# Patient Record
Sex: Male | Born: 1960 | Race: White | Hispanic: No | Marital: Married | Smoking: Never smoker
Health system: Southern US, Community
[De-identification: ages and names within clinical notes are randomized; demographics above are authoritative.]

## PROBLEM LIST (undated history)

## (undated) DIAGNOSIS — E039 Hypothyroidism, unspecified: Secondary | ICD-10-CM

## (undated) DIAGNOSIS — M1990 Unspecified osteoarthritis, unspecified site: Secondary | ICD-10-CM

## (undated) HISTORY — PX: ROTATOR CUFF REPAIR: SHX139

## (undated) HISTORY — PX: ANKLE ARTHROSCOPY WITH REPAIR SUBLUXING TENDON: SHX5584

## (undated) HISTORY — PX: TENDON REPAIR: SHX5111

## (undated) HISTORY — PX: TOTAL KNEE ARTHROPLASTY: SHX125

## (undated) HISTORY — PX: AMPUTATION FINGER: SHX6594

## (undated) HISTORY — PX: JOINT REPLACEMENT: SHX530

---

## 2016-07-22 ENCOUNTER — Encounter
Admission: RE | Admit: 2016-07-22 | Discharge: 2016-07-22 | Disposition: A | Discharge status: Home / Self Care | Payer: BLUE CROSS/BLUE SHIELD | Source: Ambulatory Visit | Attending: Orthopedic Surgery | Admitting: Orthopedic Surgery

## 2016-07-22 DIAGNOSIS — Z01810 Encounter for preprocedural cardiovascular examination: Secondary | ICD-10-CM | POA: Insufficient documentation

## 2016-07-22 DIAGNOSIS — I10 Essential (primary) hypertension: Secondary | ICD-10-CM | POA: Insufficient documentation

## 2016-07-22 DIAGNOSIS — Z01812 Encounter for preprocedural laboratory examination: Secondary | ICD-10-CM | POA: Insufficient documentation

## 2016-07-22 HISTORY — DX: Unspecified osteoarthritis, unspecified site: M19.90

## 2016-07-22 LAB — CBC
HCT: 43.1 % (ref 40.0–52.0)
HEMOGLOBIN: 14.1 g/dL (ref 13.0–18.0)
MCH: 26.6 pg (ref 26.0–34.0)
MCHC: 32.7 g/dL (ref 32.0–36.0)
MCV: 81.3 fL (ref 80.0–100.0)
Platelets: 248 10*3/uL (ref 150–440)
RBC: 5.3 MIL/uL (ref 4.40–5.90)
RDW: 16.7 % — AB (ref 11.5–14.5)
WBC: 5.5 10*3/uL (ref 3.8–10.6)

## 2016-07-22 LAB — PROTIME-INR
INR: 1
PROTHROMBIN TIME: 13.2 s (ref 11.4–15.2)

## 2016-07-22 LAB — BASIC METABOLIC PANEL
Anion gap: 8 (ref 5–15)
BUN: 17 mg/dL (ref 6–20)
CO2: 30 mmol/L (ref 22–32)
Calcium: 9.5 mg/dL (ref 8.9–10.3)
Chloride: 102 mmol/L (ref 101–111)
Creatinine, Ser: 0.84 mg/dL (ref 0.61–1.24)
GFR calc Af Amer: >60 mL/min (ref >60)
Glucose, Bld: 128 mg/dL — ABNORMAL HIGH (ref 65–99)
POTASSIUM: 4.4 mmol/L (ref 3.5–5.1)
SODIUM: 140 mmol/L (ref 135–145)

## 2016-07-22 LAB — URINALYSIS, COMPLETE (UACMP) WITH MICROSCOPIC
BACTERIA UA: NONE SEEN
Bilirubin Urine: NEGATIVE
Glucose, UA: NEGATIVE mg/dL
Hgb urine dipstick: NEGATIVE
Ketones, ur: NEGATIVE mg/dL
Leukocytes, UA: NEGATIVE
Nitrite: NEGATIVE
PROTEIN: NEGATIVE mg/dL
RBC / HPF: NONE SEEN RBC/hpf (ref 0–5)
Specific Gravity, Urine: 1.019 (ref 1.005–1.030)
WBC UA: NONE SEEN WBC/hpf (ref 0–5)
pH: 5 (ref 5.0–8.0)

## 2016-07-22 LAB — SURGICAL PCR SCREEN
MRSA, PCR: NEGATIVE
Staphylococcus aureus: NEGATIVE

## 2016-07-22 LAB — SEDIMENTATION RATE: SED RATE: 1 mm/h (ref 0–20)

## 2016-07-22 LAB — TYPE AND SCREEN
ABO/RH(D): A POS
ANTIBODY SCREEN: NEGATIVE

## 2016-07-22 LAB — APTT: aPTT: 26 seconds (ref 24–36)

## 2016-07-22 NOTE — Pre-Procedure Instructions (Signed)
Rt upper thigh red and blotchy. Saw his PA yesterday.  Placed on antibiotics.  ? Cellulitis.

## 2016-07-22 NOTE — Patient Instructions (Signed)
Your procedure is scheduled on: 08/04/16 Tues Report to Same Day Surgery 2nd floor medical mall [facility](Medical Mall Entrance-take elevator on left to 2nd floor.  Check in with surgery information desk.) To find out your arrival time please call [phone] between 1PM - 3PM on 08/03/16 Mon  Remember: Instructions that are not followed completely may result in serious medical risk, up to and including death, or upon the discretion of your surgeon and anesthesiologist your surgery may need to be rescheduled.    _x___ 1. Do not eat food or drink liquids after midnight. No gum chewing or                              hard candies.     __x__ 2. No Alcohol for 24 hours before or after surgery.   __x__3. No Smoking for 24 prior to surgery.   ____  4. Bring all medications with you on the day of surgery if instructed.    __x__ 5. Notify your doctor if there is any change in your medical condition     (cold, fever, infections).     Do not wear jewelry, make-up, hairpins, clips or nail polish.  Do not wear lotions, powders, or perfumes. You may wear deodorant.  Do not shave 48 hours prior to surgery. Men may shave face and neck.  Do not bring valuables to the hospital.    [facility] is not responsible for any belongings or valuables.               Contacts, dentures or bridgework may not be worn into surgery.  Leave your suitcase in the car. After surgery it may be brought to your room.  For patients admitted to the hospital, discharge time is determined by your                       treatment team.   Patients discharged the day of surgery will not be allowed to drive home.  You will need someone to drive you home and stay with you the night of your procedure.    Please read over the following fact sheets that you were given:   [facility] Preparing for Surgery and or MRSA Information   _x___ Take anti-hypertensive (unless it includes a diuretic), cardiac, seizure, asthma,     anti-reflux and  psychiatric medicines. These include:  1. levothyroxine (SYNTHROID   2.traMADol (ULTRAM  3.  4.  5.  6.  ____Fleets enema or Magnesium Citrate as directed.   _x___ Use CHG Soap or sage wipes as directed on instruction sheet   ____ Use inhalers on the day of surgery and bring to hospital day of surgery  ____ Stop Metformin and Janumet 2 days prior to surgery.    ____ Take 1/2 of usual insulin dose the night before surgery and none on the morning     surgery.   _x___ Follow recommendations from Cardiologist, Pulmonologist or PCP regarding          stopping Aspirin, Coumadin, Pllavix ,Eliquis, Effient, or Pradaxa, and Pletal.  X____Stop Anti-inflammatories such as Advil, Aleve, Ibuprofen, Motrin, Naproxen, Naprosyn, Goodies powders or aspirin products.  Stop Meloxicam 1 week before surgery.  OK to take Tylenol and                          Celebrex.   _x___ Stop supplements  until after surgery.  But may continue Vitamin D, Vitamin B,       and multivitamin.   ____ Bring C-Pap to the hospital.

## 2016-07-23 LAB — URINE CULTURE: Culture: NO GROWTH

## 2016-08-03 MED ORDER — CEFAZOLIN SODIUM-DEXTROSE 2-4 GM/100ML-% IV SOLN: 2.0000 g | Freq: Once | INTRAVENOUS | Status: DC

## 2016-08-03 MED ORDER — TRANEXAMIC ACID 1000 MG/10ML IV SOLN
1000.0000 mg | INTRAVENOUS | Status: AC
  Administered 2016-08-04: 1000 mg via INTRAVENOUS
  Filled 2016-08-03: qty 10

## 2016-08-04 ENCOUNTER — Inpatient Hospital Stay: Payer: BLUE CROSS/BLUE SHIELD

## 2016-08-04 ENCOUNTER — Inpatient Hospital Stay: Payer: BLUE CROSS/BLUE SHIELD | Admitting: Anesthesiology

## 2016-08-04 ENCOUNTER — Encounter
Admission: RE | Disposition: A | Discharge status: Home / Self Care | Payer: Self-pay | Source: Ambulatory Visit | Attending: Orthopedic Surgery

## 2016-08-04 ENCOUNTER — Inpatient Hospital Stay
Admission: RE | Admit: 2016-08-04 | Discharge: 2016-08-06 | DRG: 470 | Disposition: A | Discharge status: Home / Self Care | Payer: BLUE CROSS/BLUE SHIELD | Source: Ambulatory Visit | Attending: Orthopedic Surgery | Admitting: Orthopedic Surgery

## 2016-08-04 ENCOUNTER — Encounter: Payer: Self-pay | Admitting: *Deleted

## 2016-08-04 DIAGNOSIS — E876 Hypokalemia: Secondary | ICD-10-CM | POA: Diagnosis not present

## 2016-08-04 DIAGNOSIS — G8918 Other acute postprocedural pain: Secondary | ICD-10-CM

## 2016-08-04 DIAGNOSIS — Z79899 Other long term (current) drug therapy: Secondary | ICD-10-CM

## 2016-08-04 DIAGNOSIS — E039 Hypothyroidism, unspecified: Secondary | ICD-10-CM | POA: Diagnosis present

## 2016-08-04 DIAGNOSIS — M1611 Unilateral primary osteoarthritis, right hip: Secondary | ICD-10-CM | POA: Diagnosis present

## 2016-08-04 DIAGNOSIS — Z419 Encounter for procedure for purposes other than remedying health state, unspecified: Secondary | ICD-10-CM

## 2016-08-04 HISTORY — PX: TOTAL HIP ARTHROPLASTY: SHX124

## 2016-08-04 HISTORY — DX: Hypothyroidism, unspecified: E03.9

## 2016-08-04 LAB — CBC
HCT: 35.8 % — ABNORMAL LOW (ref 40.0–52.0)
Hemoglobin: 11.9 g/dL — ABNORMAL LOW (ref 13.0–18.0)
MCH: 27 pg (ref 26.0–34.0)
MCHC: 33.1 g/dL (ref 32.0–36.0)
MCV: 81.6 fL (ref 80.0–100.0)
Platelets: 194 10*3/uL (ref 150–440)
RBC: 4.39 MIL/uL — ABNORMAL LOW (ref 4.40–5.90)
RDW: 16.1 % — AB (ref 11.5–14.5)
WBC: 6.5 10*3/uL (ref 3.8–10.6)

## 2016-08-04 LAB — CREATININE, SERUM
CREATININE: 0.72 mg/dL (ref 0.61–1.24)
GFR calc Af Amer: >60 mL/min (ref >60)

## 2016-08-04 LAB — ABO/RH: ABO/RH(D): A POS

## 2016-08-04 SURGERY — ARTHROPLASTY, HIP, TOTAL, ANTERIOR APPROACH
Anesthesia: Spinal | Laterality: Right | Wound class: Clean

## 2016-08-04 MED ORDER — METOCLOPRAMIDE HCL 10 MG PO TABS: 5.0000 mg | ORAL_TABLET | Freq: Three times a day (TID) | ORAL | Status: DC | PRN

## 2016-08-04 MED ORDER — BISACODYL 10 MG RE SUPP: 10.0000 mg | Freq: Every day | RECTAL | Status: DC | PRN

## 2016-08-04 MED ORDER — PROPOFOL 500 MG/50ML IV EMUL
INTRAVENOUS | Status: AC
  Filled 2016-08-04: qty 100

## 2016-08-04 MED ORDER — LEVOTHYROXINE SODIUM 150 MCG PO TABS
150.0000 ug | ORAL_TABLET | ORAL | Status: DC
  Administered 2016-08-05 – 2016-08-06 (×2): 150 ug via ORAL
  Filled 2016-08-04 (×2): qty 3

## 2016-08-04 MED ORDER — METHOCARBAMOL 1000 MG/10ML IJ SOLN
500.0000 mg | Freq: Four times a day (QID) | INTRAVENOUS | Status: DC | PRN
  Filled 2016-08-04: qty 5

## 2016-08-04 MED ORDER — ACETAMINOPHEN 325 MG PO TABS: 650.0000 mg | ORAL_TABLET | Freq: Four times a day (QID) | ORAL | Status: DC | PRN

## 2016-08-04 MED ORDER — CEFAZOLIN SODIUM-DEXTROSE 2-4 GM/100ML-% IV SOLN
INTRAVENOUS | Status: AC
  Filled 2016-08-04: qty 100

## 2016-08-04 MED ORDER — CEFAZOLIN SODIUM-DEXTROSE 2-4 GM/100ML-% IV SOLN
2.0000 g | Freq: Four times a day (QID) | INTRAVENOUS | Status: AC
  Administered 2016-08-04 (×3): 2 g via INTRAVENOUS
  Filled 2016-08-04 (×3): qty 100

## 2016-08-04 MED ORDER — HYDROMORPHONE HCL 1 MG/ML IJ SOLN
1.0000 mg | INTRAMUSCULAR | Status: DC | PRN
Start: 2016-08-04 — End: 2016-08-06
  Administered 2016-08-04: 1 mg via INTRAVENOUS
  Filled 2016-08-04: qty 1

## 2016-08-04 MED ORDER — CEFAZOLIN SODIUM-DEXTROSE 2-3 GM-% IV SOLR
INTRAVENOUS | Status: DC | PRN
  Administered 2016-08-04: 2 g via INTRAVENOUS

## 2016-08-04 MED ORDER — ACETAMINOPHEN 650 MG RE SUPP: 650.0000 mg | Freq: Four times a day (QID) | RECTAL | Status: DC | PRN

## 2016-08-04 MED ORDER — NEOMYCIN-POLYMYXIN B GU 40-200000 IR SOLN
Status: AC
  Filled 2016-08-04: qty 4

## 2016-08-04 MED ORDER — PROPOFOL 500 MG/50ML IV EMUL
INTRAVENOUS | Status: DC | PRN
  Administered 2016-08-04: 75 ug/kg/min via INTRAVENOUS

## 2016-08-04 MED ORDER — PHENYLEPHRINE HCL 10 MG/ML IJ SOLN
INTRAMUSCULAR | Status: DC | PRN
  Administered 2016-08-04: 30 ug/min via INTRAVENOUS

## 2016-08-04 MED ORDER — OXYCODONE HCL 5 MG PO TABS
5.0000 mg | ORAL_TABLET | ORAL | Status: DC | PRN
  Administered 2016-08-04 – 2016-08-06 (×10): 10 mg via ORAL
  Filled 2016-08-04 (×10): qty 2

## 2016-08-04 MED ORDER — PHENYLEPHRINE HCL 10 MG/ML IJ SOLN
INTRAMUSCULAR | Status: DC | PRN
  Administered 2016-08-04 (×2): 80 ug via INTRAVENOUS

## 2016-08-04 MED ORDER — MAGNESIUM CITRATE PO SOLN
1.0000 | Freq: Once | ORAL | Status: DC | PRN
  Filled 2016-08-04: qty 296

## 2016-08-04 MED ORDER — GLYCOPYRROLATE 0.2 MG/ML IJ SOLN
INTRAMUSCULAR | Status: AC
  Filled 2016-08-04: qty 1

## 2016-08-04 MED ORDER — FAMOTIDINE 20 MG PO TABS
20.0000 mg | ORAL_TABLET | Freq: Once | ORAL | Status: AC
  Administered 2016-08-04: 20 mg via ORAL

## 2016-08-04 MED ORDER — ONDANSETRON HCL 4 MG/2ML IJ SOLN: 4.0000 mg | Freq: Once | INTRAMUSCULAR | Status: DC | PRN

## 2016-08-04 MED ORDER — NEOMYCIN-POLYMYXIN B GU 40-200000 IR SOLN
Status: DC | PRN
Start: 2016-08-04 — End: 2016-08-04
  Administered 2016-08-04: 4 mL

## 2016-08-04 MED ORDER — BUPIVACAINE-EPINEPHRINE 0.25% -1:200000 IJ SOLN
INTRAMUSCULAR | Status: DC | PRN
Start: 2016-08-04 — End: 2016-08-04
  Administered 2016-08-04: 30 mL

## 2016-08-04 MED ORDER — MAGNESIUM HYDROXIDE 400 MG/5ML PO SUSP
30.0000 mL | Freq: Every day | ORAL | Status: DC | PRN
  Administered 2016-08-05: 30 mL via ORAL
  Filled 2016-08-04 (×2): qty 30

## 2016-08-04 MED ORDER — FENTANYL CITRATE (PF) 100 MCG/2ML IJ SOLN
INTRAMUSCULAR | Status: DC | PRN
  Administered 2016-08-04 (×2): 50 ug via INTRAVENOUS

## 2016-08-04 MED ORDER — MORPHINE SULFATE (PF) 2 MG/ML IV SOLN
2.0000 mg | INTRAVENOUS | Status: DC | PRN
  Administered 2016-08-04 – 2016-08-05 (×3): 2 mg via INTRAVENOUS
  Filled 2016-08-04 (×3): qty 1

## 2016-08-04 MED ORDER — MIDAZOLAM HCL 5 MG/5ML IJ SOLN
INTRAMUSCULAR | Status: DC | PRN
  Administered 2016-08-04 (×2): 1 mg via INTRAVENOUS

## 2016-08-04 MED ORDER — MENTHOL 3 MG MT LOZG
1.0000 | LOZENGE | OROMUCOSAL | Status: DC | PRN
  Filled 2016-08-04: qty 9

## 2016-08-04 MED ORDER — FAMOTIDINE 20 MG PO TABS
ORAL_TABLET | ORAL | Status: AC
  Administered 2016-08-04: 20 mg via ORAL
  Filled 2016-08-04: qty 1

## 2016-08-04 MED ORDER — FENTANYL CITRATE (PF) 100 MCG/2ML IJ SOLN
INTRAMUSCULAR | Status: AC
  Filled 2016-08-04: qty 2

## 2016-08-04 MED ORDER — ONDANSETRON HCL 4 MG PO TABS: 4.0000 mg | ORAL_TABLET | Freq: Four times a day (QID) | ORAL | Status: DC | PRN

## 2016-08-04 MED ORDER — GLYCOPYRROLATE 0.2 MG/ML IJ SOLN
INTRAMUSCULAR | Status: DC | PRN
  Administered 2016-08-04: 0.2 mg via INTRAVENOUS

## 2016-08-04 MED ORDER — DIPHENHYDRAMINE HCL 12.5 MG/5ML PO ELIX: 12.5000 mg | ORAL_SOLUTION | ORAL | Status: DC | PRN

## 2016-08-04 MED ORDER — ZOLPIDEM TARTRATE 5 MG PO TABS
5.0000 mg | ORAL_TABLET | Freq: Every evening | ORAL | Status: DC | PRN
  Administered 2016-08-05 (×2): 5 mg via ORAL
  Filled 2016-08-04 (×2): qty 1

## 2016-08-04 MED ORDER — ONDANSETRON HCL 4 MG/2ML IJ SOLN: 4.0000 mg | Freq: Four times a day (QID) | INTRAMUSCULAR | Status: DC | PRN

## 2016-08-04 MED ORDER — METOCLOPRAMIDE HCL 5 MG/ML IJ SOLN: 5.0000 mg | Freq: Three times a day (TID) | INTRAMUSCULAR | Status: DC | PRN

## 2016-08-04 MED ORDER — LACTATED RINGERS IV SOLN
INTRAVENOUS | Status: DC
  Administered 2016-08-04 (×2): via INTRAVENOUS

## 2016-08-04 MED ORDER — BUPIVACAINE-EPINEPHRINE (PF) 0.25% -1:200000 IJ SOLN
INTRAMUSCULAR | Status: AC
  Filled 2016-08-04: qty 30

## 2016-08-04 MED ORDER — MIDAZOLAM HCL 2 MG/2ML IJ SOLN
INTRAMUSCULAR | Status: AC
  Filled 2016-08-04: qty 2

## 2016-08-04 MED ORDER — PHENOL 1.4 % MT LIQD
1.0000 | OROMUCOSAL | Status: DC | PRN
  Filled 2016-08-04: qty 177

## 2016-08-04 MED ORDER — SODIUM CHLORIDE 0.9 % IV SOLN
INTRAVENOUS | Status: DC
  Administered 2016-08-04 (×2): via INTRAVENOUS

## 2016-08-04 MED ORDER — ENOXAPARIN SODIUM 40 MG/0.4ML ~~LOC~~ SOLN
40.0000 mg | SUBCUTANEOUS | Status: DC
  Administered 2016-08-05 – 2016-08-06 (×2): 40 mg via SUBCUTANEOUS
  Filled 2016-08-04 (×2): qty 0.4

## 2016-08-04 MED ORDER — HYDROMORPHONE HCL 1 MG/ML IJ SOLN
1.0000 mg | Freq: Once | INTRAMUSCULAR | Status: AC
  Administered 2016-08-04: 1 mg via INTRAVENOUS
  Filled 2016-08-04: qty 1

## 2016-08-04 MED ORDER — DOCUSATE SODIUM 100 MG PO CAPS
100.0000 mg | ORAL_CAPSULE | Freq: Two times a day (BID) | ORAL | Status: DC
  Administered 2016-08-04 – 2016-08-05 (×3): 100 mg via ORAL
  Filled 2016-08-04 (×5): qty 1

## 2016-08-04 MED ORDER — FENTANYL CITRATE (PF) 100 MCG/2ML IJ SOLN: 25.0000 ug | INTRAMUSCULAR | Status: DC | PRN

## 2016-08-04 MED ORDER — METHOCARBAMOL 500 MG PO TABS
500.0000 mg | ORAL_TABLET | Freq: Four times a day (QID) | ORAL | Status: DC | PRN
  Administered 2016-08-04 – 2016-08-05 (×4): 500 mg via ORAL
  Filled 2016-08-04 (×4): qty 1

## 2016-08-04 MED ORDER — LIDOCAINE HCL (PF) 2 % IJ SOLN
INTRAMUSCULAR | Status: AC
  Filled 2016-08-04: qty 2

## 2016-08-04 SURGICAL SUPPLY — 49 items
BLADE SAW SAG 18.5X105 (BLADE) ×2 IMPLANT
BNDG COHESIVE 6X5 TAN STRL LF (GAUZE/BANDAGES/DRESSINGS) ×6 IMPLANT
CANISTER SUCT 1200ML W/VALVE (MISCELLANEOUS) ×2 IMPLANT
CAPT HIP TOTAL 3 ×2 IMPLANT
CATH FOL LEG HOLDER (MISCELLANEOUS) ×2 IMPLANT
CATH TRAY METER 16FR LF (MISCELLANEOUS) ×2 IMPLANT
CHLORAPREP W/TINT 26ML (MISCELLANEOUS) ×2 IMPLANT
DRAPE C-ARM XRAY 36X54 (DRAPES) ×2 IMPLANT
DRAPE INCISE IOBAN 66X60 STRL (DRAPES) IMPLANT
DRAPE POUCH INSTRU U-SHP 10X18 (DRAPES) ×2 IMPLANT
DRAPE SHEET LG 3/4 BI-LAMINATE (DRAPES) ×6 IMPLANT
DRAPE TABLE BACK 80X90 (DRAPES) ×2 IMPLANT
DRESSING SURGICEL FIBRLLR 1X2 (HEMOSTASIS) ×2 IMPLANT
DRSG OPSITE POSTOP 4X8 (GAUZE/BANDAGES/DRESSINGS) ×4 IMPLANT
DRSG SURGICEL FIBRILLAR 1X2 (HEMOSTASIS) ×4
ELECT BLADE 6.5 EXT (BLADE) ×2 IMPLANT
ELECT REM PT RETURN 9FT ADLT (ELECTROSURGICAL) ×2
ELECTRODE REM PT RTRN 9FT ADLT (ELECTROSURGICAL) ×1 IMPLANT
GLOVE BIOGEL PI IND STRL 9 (GLOVE) ×1 IMPLANT
GLOVE BIOGEL PI INDICATOR 9 (GLOVE) ×1
GLOVE SURG SYN 9.0  PF PI (GLOVE) ×2
GLOVE SURG SYN 9.0 PF PI (GLOVE) ×2 IMPLANT
GOWN SRG 2XL LVL 4 RGLN SLV (GOWNS) ×1 IMPLANT
GOWN STRL NON-REIN 2XL LVL4 (GOWNS) ×1
GOWN STRL REUS W/ TWL LRG LVL3 (GOWN DISPOSABLE) ×1 IMPLANT
GOWN STRL REUS W/TWL LRG LVL3 (GOWN DISPOSABLE) ×1
HEMOVAC 400CC 10FR (MISCELLANEOUS) IMPLANT
HOOD PEEL AWAY FLYTE STAYCOOL (MISCELLANEOUS) ×2 IMPLANT
KIT PREVENA INCISION MGT 13 (CANNISTER) ×2 IMPLANT
MAT BLUE FLOOR 46X72 FLO (MISCELLANEOUS) ×2 IMPLANT
NDL SAFETY 18GX1.5 (NEEDLE) ×2 IMPLANT
NEEDLE SPNL 18GX3.5 QUINCKE PK (NEEDLE) ×2 IMPLANT
NS IRRIG 1000ML POUR BTL (IV SOLUTION) ×2 IMPLANT
PACK HIP COMPR (MISCELLANEOUS) ×2 IMPLANT
SOL PREP PVP 2OZ (MISCELLANEOUS) ×2
SOLUTION PREP PVP 2OZ (MISCELLANEOUS) ×1 IMPLANT
SPONGE DRAIN TRACH 4X4 STRL 2S (GAUZE/BANDAGES/DRESSINGS) ×2 IMPLANT
STAPLER SKIN PROX 35W (STAPLE) ×2 IMPLANT
STRAP SAFETY BODY (MISCELLANEOUS) ×2 IMPLANT
SUT DVC 2 QUILL PDO  T11 36X36 (SUTURE) ×1
SUT DVC 2 QUILL PDO T11 36X36 (SUTURE) ×1 IMPLANT
SUT SILK 0 (SUTURE) ×1
SUT SILK 0 30XBRD TIE 6 (SUTURE) ×1 IMPLANT
SUT V-LOC 90 ABS DVC 3-0 CL (SUTURE) ×2 IMPLANT
SUT VIC AB 1 CT1 36 (SUTURE) ×2 IMPLANT
SYR 20CC LL (SYRINGE) ×2 IMPLANT
SYR 30ML LL (SYRINGE) ×2 IMPLANT
TAPE MICROFOAM 4IN (TAPE) ×2 IMPLANT
TOWEL OR 17X26 4PK STRL BLUE (TOWEL DISPOSABLE) ×2 IMPLANT

## 2016-08-04 NOTE — Transfer of Care (Signed)
Immediate Anesthesia Transfer of Care Note  Patient: Lee Short  Procedure(s) Performed: Procedure(s): TOTAL HIP ARTHROPLASTY ANTERIOR APPROACH (Right)  Patient Location: PACU  Anesthesia Type:Spinal  Level of Consciousness: awake, alert  and oriented  Airway & Oxygen Therapy: Patient Spontanous Breathing  Post-op Assessment: Report given to RN and Post -op Vital signs reviewed and stable  Post vital signs: Reviewed and stable  Last Vitals:  Vitals:   08/04/16 0618  BP: 140/89  Pulse: 78  Resp: 18  Temp: 36.9 C    Last Pain:  Vitals:   08/04/16 0618  TempSrc: Oral  PainSc: 8          Complications: No apparent anesthesia complications

## 2016-08-04 NOTE — Progress Notes (Signed)
Pt has had 10mg  oxycodone, 500mg  robaxin, and 2mg  morphine w/ no pain relief.  Pain is 10/10 in his right hip and lower back, he is clutching the bed rails.  Dr. Rosita KeaMenz called. He says to order 1mg  dilaudid IV. Med ordered.

## 2016-08-04 NOTE — Plan of Care (Signed)
Problem: Pain Management: Goal: Pain level will decrease with appropriate interventions Outcome: Progressing Pt did have trouble controlling pain earlier post op, but this has been addressed and he seems to be doing better. Worked w/ PT.

## 2016-08-04 NOTE — Evaluation (Signed)
Physical Therapy Evaluation Patient Details Name: Lee Short MRN: [id number] DOB: [birth year] Today's Date: 08/04/2016   History of Present Illness  Pt is a 56 y.o. male s/p R THA anterior approach 08/04/16.  PMH includes B TKR's, R rotator cuff repair.  Clinical Impression  Pt seen for PT evaluation on POD #0.  Prior to hospital admission, pt was independent with functional mobility (required use of RW past 3 days d/t significant R hip pain).  Pt lives with wife and daughter on main level of home with ramp to enter.  Currently pt is min assist supine to sit, min assist to stand, and CGA to ambulate a few feet bed to chair with RW.  Pt's R hip pain 7.5/10 beginning of session and 8/10 end of session (prior to PT session, nursing reporting pt with significant pain earlier this afternoon but was now doing better and appropriate for therapy).  Pt would benefit from skilled PT to address noted impairments and functional limitations (see below for any additional details).  Upon hospital discharge, recommend pt discharge to home with HHPT.  Pt will require new RW for safe discharge home (RW pt recently obtained from [employer] is too short for pt).    Follow Up Recommendations Home health PT    Equipment Recommendations  Rolling walker with 5" wheels;3in1 (PT)    Recommendations for Other Services       Precautions / Restrictions Precautions Precautions: Fall;Knee;Anterior Hip Restrictions Weight Bearing Restrictions: Yes RLE Weight Bearing: Weight bearing as tolerated      Mobility  Bed Mobility Overal bed mobility: Needs Assistance Bed Mobility: Supine to Sit     Supine to sit: Min assist;HOB elevated     General bed mobility comments: assist for R LE; vc's for technique and use of bed rail  Transfers Overall transfer level: Needs assistance Equipment used: Rolling walker (2 wheeled) Transfers: Sit to/from Stand Sit to Stand: Min assist         General transfer  comment: assist to initiate stand; vc's for hand and LE placement  Ambulation/Gait Ambulation/Gait assistance: Min guard Ambulation Distance (Feet): 3 Feet (bed to recliner) Assistive device: Rolling walker (2 wheeled)   Gait velocity: decreased   General Gait Details: antalgic; decreased stance time R LE; vc's for walker use and gait technique  Stairs            Wheelchair Mobility    Modified Rankin (Stroke Patients Only)       Balance Overall balance assessment: Needs assistance Sitting-balance support: Bilateral upper extremity supported;Feet supported Sitting balance-Leahy Scale: Fair Sitting balance - Comments: static sitting   Standing balance support: Bilateral upper extremity supported (on RW) Standing balance-Leahy Scale: Fair Standing balance comment: static standing                             Pertinent Vitals/Pain Pain Assessment: 0-10 Pain Score: 8  Pain Location: R hip Pain Descriptors / Indicators: Constant;Sore;Tender Pain Intervention(s): Limited activity within patient's tolerance;Monitored during session;Premedicated before session;Repositioned;Ice applied  Vitals (HR and O2 on room air) stable and WFL throughout treatment session.    Home Living Family/patient expects to be discharged to:: Private residence Living Arrangements: Spouse/significant other;Children (69 y.o. daughter) Available Help at Discharge: Family Type of Home: House Home Access: Ramped entrance (or 2 steps no rail from front)     Home Layout: Two level;Able to live on main level with bedroom/bathroom Home Equipment: Grab bars -  tub/shower;Shower seat - built in      Prior Function Level of Independence: Independent         Comments: Pt normally independent but has required use of RW last 3 days d/t significant R hip pain (upon further assessment, pt's walker is too short for appropriate pt use; pt reports his wife picked it up from [employer]; pt and  pt's wife educated on appropriate height for walker).  Pt denies any falls in past 6 months.     Hand Dominance        Extremity/Trunk Assessment   Upper Extremity Assessment Upper Extremity Assessment: Overall WFL for tasks assessed    Lower Extremity Assessment Lower Extremity Assessment: RLE deficits/detail;LLE deficits/detail RLE Deficits / Details: hip flexion at least 2/5 (limited d/t pain); knee flexion/extension at least 3+/5; DF at least 3+/5 RLE: Unable to fully assess due to pain LLE Deficits / Details: strength and ROM WFL    Cervical / Trunk Assessment Cervical / Trunk Assessment: Normal  Communication   Communication: No difficulties  Cognition Arousal/Alertness: Awake/alert Behavior During Therapy: Anxious Overall Cognitive Status: Within Functional Limits for tasks assessed                                        General Comments General comments (skin integrity, edema, etc.): wound vac and foley in place.  Nursing cleared pt for participation in physical therapy.  Pt agreeable to PT session.    Exercises Total Joint Exercises Ankle Circles/Pumps: AROM;Strengthening;Both;10 reps;Supine Quad Sets: AROM;Strengthening;Both;10 reps;Supine Gluteal Sets: AROM;Strengthening;Both;10 reps;Supine Towel Squeeze: AROM;Strengthening;Both;10 reps;Supine (pillow between pt's knees) Short Arc Quad: AAROM;Strengthening;Right;10 reps;Supine Heel Slides: AAROM;Strengthening;Right;10 reps;Supine Hip ABduction/ADduction: AAROM;Strengthening;Right;10 reps;Supine    Assessment/Plan    PT Assessment Patient needs continued PT services  PT Problem List Decreased strength;Decreased activity tolerance;Decreased balance;Decreased mobility;Decreased knowledge of use of DME;Decreased knowledge of precautions;Pain       PT Treatment Interventions DME instruction;Gait training;Stair training;Functional mobility training;Therapeutic activities;Therapeutic  exercise;Balance training;Patient/family education    PT Goals (Current goals can be found in the Care Plan section)  Acute Rehab PT Goals Patient Stated Goal: to go home PT Goal Formulation: With patient Time For Goal Achievement: 08/18/16 Potential to Achieve Goals: Good    Frequency BID   Barriers to discharge        Co-evaluation               AM-PAC PT "6 Clicks" Daily Activity  Outcome Measure Difficulty turning over in bed (including adjusting bedclothes, sheets and blankets)?: Total Difficulty moving from lying on back to sitting on the side of the bed? : Total Difficulty sitting down on and standing up from a chair with arms (e.g., wheelchair, bedside commode, etc,.)?: Total Help needed moving to and from a bed to chair (including a wheelchair)?: A Little Help needed walking in hospital room?: A Little Help needed climbing 3-5 steps with a railing? : A Little 6 Click Score: 12    End of Session Equipment Utilized During Treatment: Gait belt Activity Tolerance: Patient limited by pain Patient left: in chair;with call bell/phone within reach;with chair alarm set;with family/visitor present;with SCD's reapplied (B heels elevated via pillow; ice pack applied to R LE) Nurse Communication: Mobility status;Precautions;Weight bearing status (Pt's pain level) PT Visit Diagnosis: Difficulty in walking, not elsewhere classified (R26.2);Pain Pain - Right/Left: Right Pain - part of body: Hip    Time:  1532-1600 PT Time Calculation (min) (ACUTE ONLY): 28 min   Charges:   PT Evaluation $PT Eval Low Complexity: 1 Procedure PT Treatments $Therapeutic Exercise: 8-22 mins   PT G CodesHendricks Limes, PT 08/04/16, 5:18 PM [phone]

## 2016-08-04 NOTE — Anesthesia Post-op Follow-up Note (Cosign Needed)
Anesthesia QCDR form completed.        

## 2016-08-04 NOTE — H&P (Signed)
Reviewed paper H+P, will be scanned into chart. No changes noted.  

## 2016-08-04 NOTE — Anesthesia Preprocedure Evaluation (Signed)
Anesthesia Evaluation  Patient identified by MRN, date of birth, ID band Patient awake    Reviewed: Allergy & Precautions, NPO status , Patient's Chart, lab work & pertinent test results  History of Anesthesia Complications Negative for: history of anesthetic complications  Airway Mallampati: III       Dental   Pulmonary neg pulmonary ROS,           Cardiovascular negative cardio ROS       Neuro/Psych negative neurological ROS     GI/Hepatic negative GI ROS, Neg liver ROS,   Endo/Other  Hypothyroidism   Renal/GU negative Renal ROS     Musculoskeletal   Abdominal   Peds  Hematology   Anesthesia Other Findings   Reproductive/Obstetrics                             Anesthesia Physical Anesthesia Plan  ASA: II  Anesthesia Plan: Spinal   Post-op Pain Management:    Induction:   PONV Risk Score and Plan:   Airway Management Planned:   Additional Equipment:   Intra-op Plan:   Post-operative Plan:   Informed Consent: I have reviewed the patients History and Physical, chart, labs and discussed the procedure including the risks, benefits and alternatives for the proposed anesthesia with the patient or authorized representative who has indicated his/her understanding and acceptance.     Plan Discussed with:   Anesthesia Plan Comments:         Anesthesia Quick Evaluation

## 2016-08-04 NOTE — [place] FL2 (Signed)
[place] MEDICAID FL2 LEVEL OF CARE SCREENING TOOL     IDENTIFICATION  Patient Name: Lee Short Birthdate: 03-20-1960 Sex: male Admission Date (Current Location): 08/04/2016  [facility] [place]   Facility and Address:  [facility], [address], [place], [place] [address]      Provider Number: [id number]  Attending Physician Name and Address:  Kennedy BuckerMenz, Michael, MD  Relative Name and Phone Number:       Current Level of Care: Hospital Recommended Level of Care: Skilled Nursing Facility Prior Approval Number:    Date Approved/Denied:   PASRR Number:    Discharge Plan: SNF    Current Diagnoses: Patient Active Problem List   Diagnosis Date Noted  . Primary localized osteoarthritis of right hip 08/04/2016    Orientation RESPIRATION BLADDER Height & Weight     Self, Time, Situation, Place  Normal Continent Weight: 242 lb (109.8 kg) Height:  5\' 11"  (180.3 cm)  BEHAVIORAL SYMPTOMS/MOOD NEUROLOGICAL BOWEL NUTRITION STATUS   (none)  (none) Continent Diet (Diet: Clear Liquid )  AMBULATORY STATUS COMMUNICATION OF NEEDS Skin   Extensive Assist Verbally Surgical wounds, Wound Vac (Incision: Right Hip. Provena wound vac )                       Personal Care Assistance Level of Assistance  Bathing, Feeding, Dressing Bathing Assistance: Limited assistance Feeding assistance: Independent Dressing Assistance: Limited assistance     Functional Limitations Info  Sight, Hearing, Speech Sight Info: Adequate Hearing Info: Adequate Speech Info: Adequate    SPECIAL CARE FACTORS FREQUENCY  PT (By licensed PT), OT (By licensed OT)     PT Frequency:  (5) OT Frequency:  (5)            Contractures      Additional Factors Info  Code Status, Allergies Code Status Info:  (Full Code. ) Allergies Info:  (No Known Allergies. )           Current Medications (08/04/2016):  This is the current hospital active  medication list Current Facility-Administered Medications  Medication Dose Route Frequency Provider Last Rate Last Dose  . 0.9 %  sodium chloride infusion   Intravenous Continuous Kennedy BuckerMenz, Michael, MD 100 mL/hr at 08/04/16 1110    . acetaminophen (TYLENOL) tablet 650 mg  650 mg Oral Q6H PRN Kennedy BuckerMenz, Michael, MD       Or  . acetaminophen (TYLENOL) suppository 650 mg  650 mg Rectal Q6H PRN Kennedy BuckerMenz, Michael, MD      . bisacodyl (DULCOLAX) suppository 10 mg  10 mg Rectal Daily PRN Kennedy BuckerMenz, Michael, MD      . ceFAZolin (ANCEF) 2-4 GM/100ML-% IVPB           . ceFAZolin (ANCEF) IVPB 2g/100 mL premix  2 g Intravenous Q6H Kennedy BuckerMenz, Michael, MD   Stopped at 08/04/16 1230  . diphenhydrAMINE (BENADRYL) 12.5 MG/5ML elixir 12.5-25 mg  12.5-25 mg Oral Q4H PRN Kennedy BuckerMenz, Michael, MD      . docusate sodium (COLACE) capsule 100 mg  100 mg Oral BID Kennedy BuckerMenz, Michael, MD      . Melene Muller[START ON 08/05/2016] enoxaparin (LOVENOX) injection 40 mg  40 mg Subcutaneous Q24H Kennedy BuckerMenz, Michael, MD      . Melene Muller[START ON 08/05/2016] levothyroxine (SYNTHROID, LEVOTHROID) tablet 150 mcg  150 mcg Oral Henrene HawkingBH-q7a Menz, Casimiro NeedleMichael, MD      . magnesium citrate solution 1 Bottle  1 Bottle Oral Once PRN Kennedy BuckerMenz, Michael, MD      .  magnesium hydroxide (MILK OF MAGNESIA) suspension 30 mL  30 mL Oral Daily PRN Kennedy Bucker, MD      . menthol-cetylpyridinium (CEPACOL) lozenge 3 mg  1 lozenge Oral PRN Kennedy Bucker, MD       Or  . phenol (CHLORASEPTIC) mouth spray 1 spray  1 spray Mouth/Throat PRN Kennedy Bucker, MD      . methocarbamol (ROBAXIN) tablet 500 mg  500 mg Oral Q6H PRN Kennedy Bucker, MD   500 mg at 08/04/16 1315   Or  . methocarbamol (ROBAXIN) 500 mg in dextrose 5 % 50 mL IVPB  500 mg Intravenous Q6H PRN Kennedy Bucker, MD      . metoCLOPramide (REGLAN) tablet 5-10 mg  5-10 mg Oral Q8H PRN Kennedy Bucker, MD       Or  . metoCLOPramide (REGLAN) injection 5-10 mg  5-10 mg Intravenous Q8H PRN Kennedy Bucker, MD      . morphine 2 MG/ML injection 2 mg  2 mg Intravenous Q1H PRN Kennedy Bucker, MD   2 mg at 08/04/16 1315  . ondansetron (ZOFRAN) tablet 4 mg  4 mg Oral Q6H PRN Kennedy Bucker, MD       Or  . ondansetron [facility]) injection 4 mg  4 mg Intravenous Q6H PRN Kennedy Bucker, MD      . oxyCODONE (Oxy IR/ROXICODONE) immediate release tablet 5-10 mg  5-10 mg Oral Q3H PRN Kennedy Bucker, MD   10 mg at 08/04/16 1214  . zolpidem (AMBIEN) tablet 5 mg  5 mg Oral QHS PRN,MR X 1 Kennedy Bucker, MD         Discharge Medications: Please see discharge summary for a list of discharge medications.  Relevant Imaging Results:  Relevant Lab Results:   Additional Information    Tniya Bowditch, Darleen Crocker, LCSW

## 2016-08-04 NOTE — Op Note (Signed)
08/04/2016  9:19 AM  PATIENT:  Lee Short  56 y.o. male  PRE-OPERATIVE DIAGNOSIS:  RIGHT HIP PAIN, right hip osteoarthritis severe  POST-OPERATIVE DIAGNOSIS:  RIGHT HIP PAIN, right hip arthritis severe  PROCEDURE:  Procedure(s): TOTAL HIP ARTHROPLASTY ANTERIOR APPROACH (Right)  SURGEON: Leitha SchullerMichael J Clarie Camey, MD  ASSISTANTS: None  ANESTHESIA:   spinal  EBL:  Total I/O In: 1500 [I.V.:1500] Out: 850 [Urine:150; Blood:700]  BLOOD ADMINISTERED:none  DRAINS: Incisional wound VAC   LOCAL MEDICATIONS USED:  MARCAINE     SPECIMEN:  Source of Specimen:  Right femoral head  DISPOSITION OF SPECIMEN:  PATHOLOGY  COUNTS:  YES  TOURNIQUET:  * No tourniquets in log *  IMPLANTS: Medacta Mpact  DM 54 mm cup with liner, S Biolox 28 mm head and #6 right minimax stem  DICTATION: .Dragon Dictation   The patient was brought to the operating room and after spinal anesthesia was obtained patient was placed on the operative table with the ipsilateral foot into the Medacta attachment, contralateral leg on a well-padded table. C-arm was brought in and preop template x-ray taken. After prepping and draping in usual sterile fashion appropriate patient identification and timeout procedures were completed. Anterior approach to the hip was obtained and centered over the greater trochanter and TFL muscle. The subcutaneous tissue was incised hemostasis being achieved by electrocautery. TFL fascia was incised and the muscle retracted laterally deep retractor placed. The lateral femoral circumflex vessels were identified and ligated. The anterior capsule was exposed and a capsulotomy performed. The neck was identified and a femoral neck cut carried out with a saw. The head was removed without difficulty and showed sclerotic femoral head and acetabulum. Reaming was carried out to 52 mm and a 54 mm cup trial gave appropriate tightness to the acetabular component a 54 DM cup was impacted into position. The leg was  then externally rotated and ischiofemoral and pubofemoral releases carried out. The femur was sequentially broached to a size 6 are minimax, size 6 are stem and S head trials were placed and the final components chosen. The 6 right minimax stem was inserted along with a S Biolox 28 mm head and 54 mm liner. The hip was reduced and was stable the wound was thoroughly irrigated. Fibrillar was placed along the posterior capsulotomy and inferior neck to aid in postop hemostasis The deep fascia view. Using a heavy Quill after infiltration of 30 cc of quarter percent Sensorcaine with epinephrine. 3-0 v-loc subcuticular closure followed by skin staples and incisional wound VAC  PLAN OF CARE: Admit to inpatient

## 2016-08-05 LAB — BASIC METABOLIC PANEL
Anion gap: 6 (ref 5–15)
BUN: 6 mg/dL (ref 6–20)
CHLORIDE: 98 mmol/L — AB (ref 101–111)
CO2: 31 mmol/L (ref 22–32)
CREATININE: 0.66 mg/dL (ref 0.61–1.24)
Calcium: 8.5 mg/dL — ABNORMAL LOW (ref 8.9–10.3)
GFR calc Af Amer: >60 mL/min (ref >60)
GFR calc non Af Amer: >60 mL/min (ref >60)
GLUCOSE: 148 mg/dL — AB (ref 65–99)
Potassium: 3.3 mmol/L — ABNORMAL LOW (ref 3.5–5.1)
SODIUM: 135 mmol/L (ref 135–145)

## 2016-08-05 LAB — CBC
HCT: 37.2 % — ABNORMAL LOW (ref 40.0–52.0)
HEMOGLOBIN: 12.4 g/dL — AB (ref 13.0–18.0)
MCH: 27.3 pg (ref 26.0–34.0)
MCHC: 33.2 g/dL (ref 32.0–36.0)
MCV: 82.1 fL (ref 80.0–100.0)
Platelets: 205 10*3/uL (ref 150–440)
RBC: 4.53 MIL/uL (ref 4.40–5.90)
RDW: 16 % — ABNORMAL HIGH (ref 11.5–14.5)
WBC: 7.9 10*3/uL (ref 3.8–10.6)

## 2016-08-05 MED ORDER — POTASSIUM CHLORIDE 20 MEQ PO PACK
20.0000 meq | PACK | Freq: Three times a day (TID) | ORAL | Status: AC
  Administered 2016-08-05 (×3): 20 meq via ORAL
  Filled 2016-08-05 (×3): qty 1

## 2016-08-05 NOTE — Progress Notes (Signed)
   Subjective: 1 Day Post-Op Procedure(s) (LRB): TOTAL HIP ARTHROPLASTY ANTERIOR APPROACH (Right) Patient reports pain as moderate.   Patient is doing well, complaining of gas pains abdomen. Passing gas.  Denies any CP, SOB, ABD pain. We will continue therapy today.  Plan is to go Home after hospital stay.  Objective: Vital signs in last 24 hours: Temp:  [97.5 F (36.4 C)-99.5 F (37.5 C)] 99.5 F (37.5 C) (06/19 2356) Pulse Rate:  [55-82] 82 (06/19 2356) Resp:  [18-20] 18 (06/19 2356) BP: (95-142)/(64-88) 141/77 (06/19 2356) SpO2:  [95 %-100 %] 100 % (06/19 2356) Weight:  [108.9 kg (240 lb 1.3 oz)-109.8 kg (242 lb)] 109.8 kg (242 lb) (06/19 1158)  Intake/Output from previous day: 06/19 0701 - 06/20 0700 In: 4450 [P.O.:800; I.V.:3450; IV Piggyback:200] Out: 5275 [Urine:4575; Blood:700] Intake/Output this shift: No intake/output data recorded.   Recent Labs  08/04/16 1115 08/05/16 0446  HGB 11.9* 12.4*    Recent Labs  08/04/16 1115 08/05/16 0446  WBC 6.5 7.9  RBC 4.39* 4.53  HCT 35.8* 37.2*  PLT 194 205    Recent Labs  08/04/16 1115 08/05/16 0446  NA  --  135  K  --  3.3*  CL  --  98*  CO2  --  31  BUN  --  6  CREATININE 0.72 0.66  GLUCOSE  --  148*  CALCIUM  --  8.5*   No results for input(s): LABPT, INR in the last 72 hours.  EXAM General - Patient is Alert, Appropriate and Oriented Extremity - Neurovascular intact Sensation intact distally Intact pulses distally Dorsiflexion/Plantar flexion intact No cellulitis present Compartment soft Dressing - dressing C/D/I and no drainage , wound vac intact Motor Function - intact, moving foot and toes well on exam.   Past Medical History:  Diagnosis Date  . Arthritis   . Hypothyroidism     Assessment/Plan:   1 Day Post-Op Procedure(s) (LRB): TOTAL HIP ARTHROPLASTY ANTERIOR APPROACH (Right) Active Problems:   Primary localized osteoarthritis of right hip   hypokalemia Estimated body mass  index is 33.75 kg/m as calculated from the following:   Height as of this encounter: 5\' 11"  (1.803 m).   Weight as of this encounter: 109.8 kg (242 lb). Advance diet Up with therapy  Needs BM Hypokalemia - Klor kon 20 meq TID, recheck labs in the am CM to assist with discharge     DVT Prophylaxis - Lovenox, Foot Pumps and TED hose Weight-Bearing as tolerated to right leg   T. [place] Neighborhris Christyanna Mckeon, PA-C [facility] 08/05/2016, 8:06 AM

## 2016-08-05 NOTE — Progress Notes (Signed)
Clinical Social Worker (CSW) received SNF consult. PT is recommending home health. RN case manager aware of above. Please reconsult if future social work needs arise. CSW signing off.   Sanjith Siwek, LCSW [phone] 

## 2016-08-05 NOTE — Care Management Note (Signed)
Case Management Note  Patient Details  Name: Lee Short MRN: [id number] Date of Birth: 07/25/1960  Subjective/Objective:  POD # 1 right THA. Met with patient at bedside to discuss discharge planning. Patient lives at home with his wife. He is normally independent with adls. Will need a walker. Refused a bsc. Ordered walker from [facility]. Offered choice of home health agencies. Referral to [facility] for [facility]. Pharmacy: [place]. [phone]. Called Lovenox 40 mg # 14 no refills. PCP is Dr. Ouida Sills                      Action/Plan: [facility] for [place], DME from [facility]. Lovenox called in.   Expected Discharge Date:                  Expected Discharge Plan:  [place]  In-House Referral:     Discharge planning Services  CM Consult  Post Acute Care Choice:  Durable Medical Equipment, Home Health Choice offered to:  Patient  DME Arranged:  Walker rolling DME Agency:  [place]:  PT [place] Agency:  [facility] (formerly [facility])  Status of Service:  In process, will continue to follow  If discussed at Long Length of Stay Meetings, dates discussed:    Additional Comments:  Jolly Mango, RN 08/05/2016, 2:25 PM

## 2016-08-05 NOTE — Progress Notes (Signed)
Physical Therapy Treatment Patient Details Name: Lee RuaFredrick Lovering MRN: [id number] DOB: 05-May-1960 Today's Date: 08/05/2016    History of Present Illness Pt is a 56 y.o. male s/p R THA anterior approach 08/04/16.  PMH includes B TKR's, R rotator cuff repair.    PT Comments    Pt able to progress to ambulating 80 feet with RW CGA.  Vc's required to improve gait technique (see below for details).  Distance limited d/t 7/10 R hip pain during session.  Will continue to progress pt with strengthening and increasing ambulation distance per pt tolerance.    Follow Up Recommendations  Home health PT     Equipment Recommendations  Rolling walker with 5" wheels;3in1 (PT)    Recommendations for Other Services       Precautions / Restrictions Precautions Precautions: Fall;Knee;Anterior Hip Precaution Booklet Issued: Yes (comment) Restrictions Weight Bearing Restrictions: Yes RLE Weight Bearing: Weight bearing as tolerated    Mobility  Bed Mobility               General bed mobility comments: deferred d/t pt up in recliner for session  Transfers Overall transfer level: Needs assistance Equipment used: Rolling walker (2 wheeled) Transfers: Sit to/from Stand Sit to Stand: Min guard         General transfer comment: vc's for hand and LE placement occasionally  Ambulation/Gait Ambulation/Gait assistance: Min guard Ambulation Distance (Feet): 80 Feet Assistive device: Rolling walker (2 wheeled)   Gait velocity: decreased   General Gait Details: antalgic; decreased stance time R LE; pt initially sliding R foot forward on ground; vc's for walker use and gait technique (vc's to increase R knee flexion during swing phase and increase R heel strike to begin stance phase)   Stairs            Wheelchair Mobility    Modified Rankin (Stroke Patients Only)       Balance Overall balance assessment: Needs assistance Sitting-balance support: Feet supported;Bilateral  upper extremity supported Sitting balance-Leahy Scale: Good Sitting balance - Comments: sitting reaching within BOS   Standing balance support: Bilateral upper extremity supported;During functional activity Standing balance-Leahy Scale: Good Standing balance comment: during ambulation using RW                            Cognition Arousal/Alertness: Awake/alert Behavior During Therapy: WFL for tasks assessed/performed Overall Cognitive Status: Within Functional Limits for tasks assessed                                        Exercises General Exercises - Lower Extremity Long Arc Quad: Strengthening;Both;10 reps;Seated;AROM Hip Flexion/Marching: Strengthening;Both;10 reps;Seated (AROM L; AAROM R)    General Comments General comments (skin integrity, edema, etc.): wound vac in place.  Pt agreeable to PT session.      Pertinent Vitals/Pain Pain Assessment: 0-10 Pain Score: 7  Pain Location: R hip Pain Descriptors / Indicators: Constant;Sore;Tender Pain Intervention(s): Limited activity within patient's tolerance;Monitored during session;Premedicated before session;Repositioned;Ice applied  Vitals (HR and O2 on room air) stable and WFL throughout treatment session.    Home Living Family/patient expects to be discharged to:: Private residence Living Arrangements: Spouse/significant other;Children (19yo daughter living with pt over the summers) Available Help at Discharge: Family;Available 24 hours/day Type of Home: House Home Access: Ramped entrance (1/2 step threshold )   Home Layout: Two level;Able to  live on main level with bedroom/bathroom Home Equipment: Grab bars - tub/shower;Shower seat - built in;Hand held Engineer, civil (consulting) - single point      Prior Function Level of Independence: Independent      Comments: Pt normally independent but has required use of RW last 3 days d/t significant R hip pain (upon further assessment, pt's walker is too  short for appropriate pt use; pt reports his wife picked it up from [employer]; pt and pt's wife educated on appropriate height for walker).  Pt denies any falls in past 6 months.   PT Goals (current goals can now be found in the care plan section) Acute Rehab PT Goals Patient Stated Goal: to go home PT Goal Formulation: With patient Time For Goal Achievement: 08/18/16 Potential to Achieve Goals: Good Progress towards PT goals: Progressing toward goals    Frequency    BID      PT Plan Current plan remains appropriate    Co-evaluation              AM-PAC PT "6 Clicks" Daily Activity  Outcome Measure  Difficulty turning over in bed (including adjusting bedclothes, sheets and blankets)?: Total Difficulty moving from lying on back to sitting on the side of the bed? : Total Difficulty sitting down on and standing up from a chair with arms (e.g., wheelchair, bedside commode, etc,.)?: Total Help needed moving to and from a bed to chair (including a wheelchair)?: A Little Help needed walking in hospital room?: A Little Help needed climbing 3-5 steps with a railing? : A Little 6 Click Score: 12    End of Session Equipment Utilized During Treatment: Gait belt Activity Tolerance: Patient limited by pain Patient left: in chair;with call bell/phone within reach;with chair alarm set;with family/visitor present;with SCD's reapplied (B heels elevated via pillow) Nurse Communication: Mobility status;Precautions;Weight bearing status (via white board) PT Visit Diagnosis: Difficulty in walking, not elsewhere classified (R26.2);Pain Pain - Right/Left: Right Pain - part of body: Hip     Time: 1610-9604 PT Time Calculation (min) (ACUTE ONLY): 38 min  Charges:  $Gait Training: 8-22 mins $Therapeutic Exercise: 8-22 mins $Therapeutic Activity: 8-22 mins                    G CodesHendricks Limes, PT 08/05/16, 1:26 PM [phone]

## 2016-08-05 NOTE — Plan of Care (Signed)
Problem: Pain Managment: Goal: General experience of comfort will improve Outcome: Not Progressing Pt pain continues to be 8/10 after receiving pain medications prescribed

## 2016-08-05 NOTE — Progress Notes (Signed)
Order received from Dr. Rosita KeaMenz to convert patient to saline lock

## 2016-08-05 NOTE — Progress Notes (Signed)
Pt pain states pain is not controlled with medication. Pain is 8/10 most of the time. Foley removed at 0430. prevena in place with no drainage. Dressing dry and intact. Pt uses incentive. No acute distress noted.

## 2016-08-05 NOTE — Progress Notes (Signed)
Physical Therapy Treatment Patient Details Name: Lee RuaFredrick Short MRN: [id number] DOB: Mar 19, 1960 Today's Date: 08/05/2016    History of Present Illness Pt is a 56 y.o. male s/p R THA anterior approach 08/04/16.  PMH includes B TKR's, R rotator cuff repair.    PT Comments    Pt able to progress to ambulating up to 110 feet with RW CGA; improved gait technique noted compared to morning therapy session with less cueing required.  Pain 6/10 R hip beginning and end of session.  Will continue to progress pt with strengthening and increasing ambulation distance per pt tolerance.  Pt has ramp to enter home upon discharge.    Follow Up Recommendations  Home health PT     Equipment Recommendations  Rolling walker with 5" wheels;3in1 (PT)    Recommendations for Other Services       Precautions / Restrictions Precautions Precautions: Fall;Knee;Anterior Hip Precaution Booklet Issued: Yes (comment) Restrictions Weight Bearing Restrictions: Yes RLE Weight Bearing: Weight bearing as tolerated    Mobility  Bed Mobility Overal bed mobility: Needs Assistance Bed Mobility: Supine to Sit;Sit to Supine     Supine to sit: Supervision Sit to supine: Supervision   General bed mobility comments: increased effort and time to perform; vc's for technique; pt using UE's to assist R LE into bed; bed flat  Transfers Overall transfer level: Needs assistance Equipment used: Rolling walker (2 wheeled) Transfers: Sit to/from Stand Sit to Stand: Min guard         General transfer comment: no vc's for hand and LE placement required  Ambulation/Gait Ambulation/Gait assistance: Min guard Ambulation Distance (Feet):  (20 feet (to bathroom); 110 feet) Assistive device: Rolling walker (2 wheeled) Gait Pattern/deviations: Step-to pattern Gait velocity: decreased   General Gait Details: antalgic; decreased stance time R LE; occasional vc's for walker use and gait technique (increase R knee flexion  during swing phase and increase R heel strike to begin stance phase)   Stairs            Wheelchair Mobility    Modified Rankin (Stroke Patients Only)       Balance Overall balance assessment: Needs assistance Sitting-balance support: Feet supported;No upper extremity supported Sitting balance-Leahy Scale: Good Sitting balance - Comments: sitting reaching within BOS   Standing balance support: No upper extremity supported Standing balance-Leahy Scale: Good Standing balance comment: standing toileting                            Cognition Arousal/Alertness: Awake/alert Behavior During Therapy: WFL for tasks assessed/performed Overall Cognitive Status: Within Functional Limits for tasks assessed                                        Exercises Total Joint Exercises Ankle Circles/Pumps: AROM;Strengthening;Both;10 reps;Supine Quad Sets: AROM;Strengthening;Both;10 reps;Supine Gluteal Sets: AROM;Strengthening;Both;10 reps;Supine Towel Squeeze: AROM;Strengthening;Both;10 reps;Supine Short Arc Quad: AROM;Strengthening;Right;10 reps;Supine Heel Slides: AAROM;Strengthening;Right;10 reps;Supine Hip ABduction/ADduction: AAROM;Strengthening;Right;10 reps;Supine Straight Leg Raises: AAROM;Strengthening;Right;10 reps;Supine General Exercises - Lower Extremity Long Arc Quad: Strengthening;Both;10 reps;Seated;AROM Hip Flexion/Marching: Strengthening;Both;10 reps;Seated (AROM L; AAROM R)    General Comments General comments (skin integrity, edema, etc.): wound vac in place.  Pt agreeable to PT session and requesting to toilet.      Pertinent Vitals/Pain Pain Assessment: 0-10 Pain Score: 6  Pain Location: R hip Pain Descriptors / Indicators: Constant;Sore;Tender;Burning Pain Intervention(s): Limited activity  within patient's tolerance;Monitored during session;Premedicated before session;Repositioned;Ice applied  Vitals (HR and O2 on room air) stable  and WFL throughout treatment session.    Home Living                      Prior Function            PT Goals (current goals can now be found in the care plan section) Acute Rehab PT Goals Patient Stated Goal: to go home PT Goal Formulation: With patient Time For Goal Achievement: 08/18/16 Potential to Achieve Goals: Good Progress towards PT goals: Progressing toward goals    Frequency    BID      PT Plan Current plan remains appropriate    Co-evaluation              AM-PAC PT "6 Clicks" Daily Activity  Outcome Measure  Difficulty turning over in bed (including adjusting bedclothes, sheets and blankets)?: A Lot Difficulty moving from lying on back to sitting on the side of the bed? : A Lot Difficulty sitting down on and standing up from a chair with arms (e.g., wheelchair, bedside commode, etc,.)?: A Little Help needed moving to and from a bed to chair (including a wheelchair)?: A Little Help needed walking in hospital room?: A Little Help needed climbing 3-5 steps with a railing? : A Little 6 Click Score: 16    End of Session Equipment Utilized During Treatment: Gait belt Activity Tolerance: Patient limited by pain Patient left: in bed;with call bell/phone within reach;with bed alarm set;with SCD's reapplied (B heels elevated via towel rolls) Nurse Communication: Mobility status;Precautions;Weight bearing status (via white board) PT Visit Diagnosis: Difficulty in walking, not elsewhere classified (R26.2);Pain Pain - Right/Left: Right Pain - part of body: Hip     Time: 1610-9604 PT Time Calculation (min) (ACUTE ONLY): 40 min  Charges:  $Gait Training: 8-22 mins $Therapeutic Exercise: 8-22 mins $Therapeutic Activity: 8-22 mins                    G CodesHendricks Short, PT 08/05/16, 2:34 PM [phone]

## 2016-08-05 NOTE — Anesthesia Postprocedure Evaluation (Signed)
Anesthesia Post Note  Patient: Darolyn RuaFredrick Manalo  Procedure(s) Performed: Procedure(s) (LRB): TOTAL HIP ARTHROPLASTY ANTERIOR APPROACH (Right)  Patient location during evaluation: Nursing Unit Anesthesia Type: Spinal Level of consciousness: awake, awake and alert and oriented Pain management: pain level controlled Vital Signs Assessment: post-procedure vital signs reviewed and stable Respiratory status: spontaneous breathing Cardiovascular status: blood pressure returned to baseline Postop Assessment: no headache, no signs of nausea or vomiting, no backache and adequate PO intake     Last Vitals:  Vitals:   08/04/16 2023 08/04/16 2356  BP: 131/88 (!) 141/77  Pulse: 76 82  Resp: 18 18  Temp: 37.1 C 37.5 C    Last Pain:  Vitals:   08/05/16 0527  TempSrc:   PainSc: 8                  Kyira Volkert Lawerance CruelStarr

## 2016-08-05 NOTE — Evaluation (Signed)
Occupational Therapy Evaluation Patient Details Name: Lee RuaFredrick Short MRN: [id number] DOB: 1960/06/25 Today's Date: 08/05/2016    History of Present Illness Pt is a 56 y.o. male s/p R THA anterior approach 08/04/16.  PMH includes B TKR's, R rotator cuff repair.   Clinical Impression   Pt is 56 year old male s/p R THR(anterior approach) who lives at home with his spouse. Pt was independent in all ADLs prior to surgery and is eager to return to PLOF.  Pt is currently limited in functional ADLs due to pain and decreased ROM.  Pt requires minimal assist for LB dressing and bathing skills due to pain and decreased AROM of R LE and would benefit from continued skilled OT services for education in assistive devices, functional mobility training, and education in recommendations for home modifications to increase safety and prevent falls.  Do not anticipate any further OT needs following this hospitalization.      Follow Up Recommendations  No OT follow up    Equipment Recommendations  Other (comment) (consider reacher)    Recommendations for Other Services       Precautions / Restrictions Precautions Precautions: Fall;Knee;Anterior Hip Restrictions Weight Bearing Restrictions: Yes RLE Weight Bearing: Weight bearing as tolerated      Mobility Bed Mobility               General bed mobility comments: deferred d/t pt up in recliner for session  Transfers Overall transfer level: Needs assistance Equipment used: Rolling walker (2 wheeled) Transfers: Sit to/from Stand Sit to Stand: Min guard         General transfer comment: vc for hand placement with good confidence     Balance Overall balance assessment: Needs assistance Sitting-balance support: Feet supported;Bilateral upper extremity supported Sitting balance-Leahy Scale: Good Sitting balance - Comments: static sitting   Standing balance support: No upper extremity supported;During functional activity Standing  balance-Leahy Scale: Good Standing balance comment: static standing at sink to wash hands, no LOB noted                           ADL either performed or assessed with clinical judgement   ADL Overall ADL's : Needs assistance/impaired                                       General ADL Comments: pt generally supervision level for toileting, min assist for LB ADL with spouse able to  assist independently     Vision Baseline Vision/History: Wears glasses Wears Glasses: Distance only Patient Visual Report: No change from baseline Vision Assessment?: No apparent visual deficits     Perception     Praxis      Pertinent Vitals/Pain Pain Assessment: 0-10 Pain Score: 8  Pain Location: R hip Pain Descriptors / Indicators: Constant;Sore;Tender Pain Intervention(s): Limited activity within patient's tolerance;Monitored during session;Premedicated before session;Repositioned     Hand Dominance Right   Extremity/Trunk Assessment Upper Extremity Assessment Upper Extremity Assessment: Overall WFL for tasks assessed   Lower Extremity Assessment Lower Extremity Assessment: Defer to PT evaluation;RLE deficits/detail   Cervical / Trunk Assessment Cervical / Trunk Assessment: Normal   Communication Communication Communication: No difficulties   Cognition Arousal/Alertness: Awake/alert Behavior During Therapy: WFL for tasks assessed/performed Overall Cognitive Status: Within Functional Limits for tasks assessed  General Comments  wound vac in place    Exercises Other Exercises Other Exercises: pt educated in AE for LB ADL, spouse confident in assisting with compression stockings Other Exercises: pt/spouse educated in pet care to minimize risk of falls   Shoulder Instructions      Home Living Family/patient expects to be discharged to:: Private residence Living Arrangements: Spouse/significant  other;Children (19yo daughter living with pt over the summers) Available Help at Discharge: Family;Available 24 hours/day Type of Home: House Home Access: Ramped entrance (1/2 step threshold )     Home Layout: Two level;Able to live on main level with bedroom/bathroom     Bathroom Shower/Tub: Producer, television/film/video: Handicapped height     Home Equipment: Grab bars - tub/shower;Shower seat - built in;Hand held Engineer, civil (consulting) - single point          Prior Functioning/Environment Level of Independence: Independent        Comments: Pt normally independent but has required use of RW last 3 days d/t significant R hip pain (upon further assessment, pt's walker is too short for appropriate pt use; pt reports his wife picked it up from [employer]; pt and pt's wife educated on appropriate height for walker).  Pt denies any falls in past 6 months.        OT Problem List: Pain;Decreased strength;Decreased knowledge of use of DME or AE      OT Treatment/Interventions: Self-care/ADL training;Therapeutic exercise;Therapeutic activities;Energy conservation;DME and/or AE instruction;Patient/family education    OT Goals(Current goals can be found in the care plan section) Acute Rehab OT Goals Patient Stated Goal: to go home OT Goal Formulation: With patient/family Time For Goal Achievement: 08/19/16 Potential to Achieve Goals: Good  OT Frequency: Min 1X/week   Barriers to D/C:            Co-evaluation              AM-PAC PT "6 Clicks" Daily Activity     Outcome Measure Help from another person eating meals?: None Help from another person taking care of personal grooming?: None Help from another person toileting, which includes using toliet, bedpan, or urinal?: A Little Help from another person bathing (including washing, rinsing, drying)?: A Little Help from another person to put on and taking off regular upper body clothing?: None Help from another person to  put on and taking off regular lower body clothing?: A Little 6 Click Score: 21   End of Session Equipment Utilized During Treatment: Gait belt;Rolling walker  Activity Tolerance: Patient tolerated treatment well Patient left: in chair;with call bell/phone within reach;with chair alarm set;with family/visitor present  OT Visit Diagnosis: Other abnormalities of gait and mobility (R26.89);Pain Pain - Right/Left: Right Pain - part of body: Hip                Time: 1610-9604 OT Time Calculation (min): 30 min Charges:  OT General Charges $OT Visit: 1 Procedure OT Evaluation $OT Eval Low Complexity: 1 Procedure OT Treatments $Self Care/Home Management : 8-22 mins G-Codes:     Richrd Prime, MPH, MS, OTR/L ascom [phone] 08/05/16, 10:33 AM

## 2016-08-06 LAB — BASIC METABOLIC PANEL
Anion gap: 4 — ABNORMAL LOW (ref 5–15)
BUN: 7 mg/dL (ref 6–20)
CO2: 31 mmol/L (ref 22–32)
CREATININE: 0.7 mg/dL (ref 0.61–1.24)
Calcium: 8.4 mg/dL — ABNORMAL LOW (ref 8.9–10.3)
Chloride: 100 mmol/L — ABNORMAL LOW (ref 101–111)
GFR calc non Af Amer: >60 mL/min (ref >60)
Glucose, Bld: 123 mg/dL — ABNORMAL HIGH (ref 65–99)
POTASSIUM: 3.5 mmol/L (ref 3.5–5.1)
Sodium: 135 mmol/L (ref 135–145)

## 2016-08-06 LAB — CBC
HEMATOCRIT: 34.8 % — AB (ref 40.0–52.0)
HEMOGLOBIN: 11.6 g/dL — AB (ref 13.0–18.0)
MCH: 27.6 pg (ref 26.0–34.0)
MCHC: 33.4 g/dL (ref 32.0–36.0)
MCV: 82.6 fL (ref 80.0–100.0)
Platelets: 166 10*3/uL (ref 150–440)
RBC: 4.21 MIL/uL — ABNORMAL LOW (ref 4.40–5.90)
RDW: 15.8 % — ABNORMAL HIGH (ref 11.5–14.5)
WBC: 6.9 10*3/uL (ref 3.8–10.6)

## 2016-08-06 LAB — SURGICAL PATHOLOGY

## 2016-08-06 MED ORDER — METHOCARBAMOL 500 MG PO TABS: 500.0000 mg | ORAL_TABLET | Freq: Four times a day (QID) | ORAL | 0 refills | Status: DC | PRN

## 2016-08-06 MED ORDER — ENOXAPARIN SODIUM 40 MG/0.4ML ~~LOC~~ SOLN: 40.0000 mg | SUBCUTANEOUS | 0 refills | Status: DC

## 2016-08-06 MED ORDER — OXYCODONE HCL 5 MG PO TABS: 5.0000 mg | ORAL_TABLET | ORAL | 0 refills | Status: DC | PRN

## 2016-08-06 NOTE — Progress Notes (Signed)
Physical Therapy Treatment Patient Details Name: Lee Short MRN: [id number] DOB: 1961-01-20 Today's Date: 08/06/2016    History of Present Illness Pt is a 56 y.o. male s/p R THA anterior approach 08/04/16.  PMH includes B TKR's, R rotator cuff repair.    PT Comments    Participated in exercises as described below.  Stood and was able to ambulate around unit and up/down 4 steps with bilateral rails with supervision/min guard.  Overall progressing well.  Pt with improving gait quality and pattern without LOB or buckling.  Pt is comfortable with discharge plan.   Follow Up Recommendations  Home health PT     Equipment Recommendations  Rolling walker with 5" wheels;3in1 (PT)    Recommendations for Other Services       Precautions / Restrictions Precautions Precautions: Fall;Knee;Anterior Hip Restrictions Weight Bearing Restrictions: Yes RLE Weight Bearing: Weight bearing as tolerated    Mobility  Bed Mobility Overal bed mobility: Needs Assistance         Sit to supine: Min assist   General bed mobility comments: Asssit for LE's  Transfers Overall transfer level: Needs assistance Equipment used: Rolling walker (2 wheeled) Transfers: Sit to/from Stand Sit to Stand: Supervision         General transfer comment: no vc's for hand and LE placement required  Ambulation/Gait Ambulation/Gait assistance: Min guard Ambulation Distance (Feet): 220 Feet Assistive device: Rolling walker (2 wheeled) Gait Pattern/deviations: Step-through pattern;Step-to pattern;Decreased step length - right;Decreased step length - left Gait velocity: decreased Gait velocity interpretation: Below normal speed for age/gender General Gait Details: gait pattern progressing throughout session to a more normal gait pattern.  Step length remains decreased bialterally.   Stairs Stairs: Yes   Stair Management: Two rails Number of Stairs: 4    Wheelchair Mobility    Modified Rankin  (Stroke Patients Only)       Balance Overall balance assessment: Needs assistance Sitting-balance support: Feet supported;No upper extremity supported Sitting balance-Leahy Scale: Good     Standing balance support: Bilateral upper extremity supported Standing balance-Leahy Scale: Good                              Cognition Arousal/Alertness: Awake/alert Behavior During Therapy: WFL for tasks assessed/performed Overall Cognitive Status: Within Functional Limits for tasks assessed                                        Exercises Other Exercises Other Exercises: Pt doing HEP upon arrival to room.  Pt with no questions regarding HEP.  Standing hip and knee flexion and SLR prior to gait.    General Comments        Pertinent Vitals/Pain Pain Assessment: 0-10 Pain Score: 6  Pain Location: R hip Pain Descriptors / Indicators: Constant;Sore;Tender;Burning Pain Intervention(s): Limited activity within patient's tolerance;Premedicated before session;Monitored during session    Home Living                      Prior Function            PT Goals (current goals can now be found in the care plan section) Progress towards PT goals: Progressing toward goals    Frequency    BID      PT Plan Current plan remains appropriate    Co-evaluation  AM-PAC PT "6 Clicks" Daily Activity  Outcome Measure  Difficulty turning over in bed (including adjusting bedclothes, sheets and blankets)?: A Little Difficulty moving from lying on back to sitting on the side of the bed? : A Lot Difficulty sitting down on and standing up from a chair with arms (e.g., wheelchair, bedside commode, etc,.)?: A Little Help needed moving to and from a bed to chair (including a wheelchair)?: A Little Help needed walking in hospital room?: A Little Help needed climbing 3-5 steps with a railing? : A Little 6 Click Score: 17    End of Session  Equipment Utilized During Treatment: Gait belt Activity Tolerance: Patient tolerated treatment well Patient left: in bed;with call bell/phone within reach;with bed alarm set   Pain - Right/Left: Right Pain - part of body: Hip     Time: [id number] PT Time Calculation (min) (ACUTE ONLY): 23 min  Charges:  $Gait Training: 8-22 mins $Therapeutic Exercise: 8-22 mins                    G Codes:       Danielle DessSarah Shaylee Stanislawski, PTA 08/06/16, 9:20 AM

## 2016-08-06 NOTE — Progress Notes (Signed)
Pt discharged to home via wheelchair without incident per MD order accompanied by wife. Prior to discharge, all discharge teachings done both written and verbal. All questions answered. Pt and wife verbalize understanding of all teachings and agree to comply. Pt discharged with prescriptions for Lovenox, oxycodone and robaxin. Pt wife states that she has given the Lovenox injections before. Wife able to verbalize correct administration of medication. Pt discharged with Lovenox starter kit. Pt discharged with replacement dressing for hip. Pt is able to verbalize that he applies dressing when the battery runs out on wound vac which is usually approx 7 days. Pt has follow up appointment with MD.

## 2016-08-06 NOTE — Discharge Instructions (Signed)

## 2016-08-06 NOTE — Discharge Summary (Signed)
Physician Discharge Summary  Patient ID: Lee Short MRN: [id number] DOB/AGE: 09-17-1960 56 y.o.  Admit date: 08/04/2016 Discharge date: 08/06/2016  Admission Diagnoses:  RIGHT HIP PAIN   Discharge Diagnoses: Patient Active Problem List   Diagnosis Date Noted  . Primary localized osteoarthritis of right hip 08/04/2016    Past Medical History:  Diagnosis Date  . Arthritis   . Hypothyroidism      Transfusion: none   Consultants (if any):   Discharged Condition: Improved  Hospital Course: Lee RuaFredrick Dunson is an 56 y.o. male who was admitted 08/04/2016 with a diagnosis of right hip OA and went to the operating room on 08/04/2016 and underwent the above named procedures.    Surgeries: Procedure(s): TOTAL HIP ARTHROPLASTY ANTERIOR APPROACH on 08/04/2016 Patient tolerated the surgery well. Taken to PACU where she was stabilized and then transferred to the orthopedic floor.  Started on Lovenox 40 q 24 hrs. Foot pumps applied bilaterally at 80 mm. Heels elevated on bed with rolled towels. No evidence of DVT. Negative Homan. Physical therapy started on day #1 for gait training and transfer. OT started day #1 for ADL and assisted devices.  Patient's foley was d/c on day #1. Patient's  hemovac was d/c on day #2.  On post op day #2 patient was stable and ready for discharge to home with HHPT.  Implants: Medacta Mpact  DM 54 mm cup with liner, S Biolox 28 mm head and #6 right minimax stem  He was given perioperative antibiotics:  Anti-infectives    Start     Dose/Rate Route Frequency Ordered Stop   08/04/16 1200  ceFAZolin (ANCEF) IVPB 2g/100 mL premix     2 g 200 mL/hr over 30 Minutes Intravenous Every 6 hours 08/04/16 1103 08/04/16 2355   08/04/16 0602  ceFAZolin (ANCEF) 2-4 GM/100ML-% IVPB    Comments:  Slemenda, Debbie: cabinet override      08/04/16 0602 08/04/16 1814   08/03/16 2245  ceFAZolin (ANCEF) IVPB 2g/100 mL premix  Status:  Discontinued     2 g 200 mL/hr over  30 Minutes Intravenous  Once 08/03/16 2238 08/04/16 1057    .  He was given sequential compression devices, early ambulation, and loenox for DVT prophylaxis.  He benefited maximally from the hospital stay and there were no complications.    Recent vital signs:  Vitals:   08/05/16 1547 08/05/16 2251  BP: (!) 161/91 (!) 147/80  Pulse: 84   Resp: 16   Temp: 99.8 F (37.7 C) 98.5 F (36.9 C)    Recent laboratory studies:  Lab Results  Component Value Date   HGB 11.6 (L) 08/06/2016   HGB 12.4 (L) 08/05/2016   HGB 11.9 (L) 08/04/2016   Lab Results  Component Value Date   WBC 6.9 08/06/2016   PLT 166 08/06/2016   Lab Results  Component Value Date   INR 1.00 07/22/2016   Lab Results  Component Value Date   NA 135 08/06/2016   K 3.5 08/06/2016   CL 100 (L) 08/06/2016   CO2 31 08/06/2016   BUN 7 08/06/2016   CREATININE 0.70 08/06/2016   GLUCOSE 123 (H) 08/06/2016    Discharge Medications:   Allergies as of 08/06/2016   No Known Allergies     Medication List    STOP taking these medications   meloxicam 15 MG tablet Commonly known as:  MOBIC   traMADol 50 MG tablet Commonly known as:  ULTRAM     TAKE these medications  doxycycline 100 MG EC tablet Commonly known as:  DORYX Take 100 mg by mouth 2 (two) times daily.   enoxaparin 40 MG/0.4ML injection Commonly known as:  LOVENOX Inject 0.4 mLs (40 mg total) into the skin daily.   levothyroxine 150 MCG tablet Commonly known as:  SYNTHROID, LEVOTHROID Take 1 tablet by mouth every morning.   methocarbamol 500 MG tablet Commonly known as:  ROBAXIN Take 1 tablet (500 mg total) by mouth every 6 (six) hours as needed for muscle spasms.   oxyCODONE 5 MG immediate release tablet Commonly known as:  Oxy IR/ROXICODONE Take 1-2 tablets (5-10 mg total) by mouth every 4 (four) hours as needed for breakthrough pain.            Durable Medical Equipment        Start     Ordered   08/04/16 1103  DME Walker  rolling  Once    Question:  Patient needs a walker to treat with the following condition  Answer:  Status post total hip replacement, right   08/04/16 1103   08/04/16 1103  DME 3 n 1  Once     08/04/16 1103   08/04/16 1103  DME Bedside commode  Once    Question:  Patient needs a bedside commode to treat with the following condition  Answer:  Status post total hip replacement, right   08/04/16 1103      Diagnostic Studies: Dg Hip Operative Unilat W Or W/o Pelvis Right  Result Date: 08/04/2016 CLINICAL DATA:  Right total hip prosthesis placement using anterior approach. 6 seconds of fluoro time reported. EXAM: OPERATIVE right HIP (WITH PELVIS IF PERFORMED) 3 VIEWS TECHNIQUE: Fluoroscopic spot image(s) were submitted for interpretation post-operatively. COMPARISON:  None. FINDINGS: The initial image reveals the preoperative appearance of the right hip. The subsequent images reveal the patient to have undergone placement of the prosthesis. Radiographic positioning of the prosthetic components is good. IMPRESSION: The patient has undergone anterior approach right total hip joint prosthesis placement. There is no immediate postprocedure complication. Electronically Signed   By: David  [place] M.D.   On: 08/04/2016 09:08   Dg Hip Unilat W Or W/o Pelvis 2-3 Views Right  Result Date: 08/04/2016 CLINICAL DATA:  RIGHT hip postop EXAM: DG HIP (WITH OR WITHOUT PELVIS) 2-3V RIGHT COMPARISON:  None. FINDINGS: RIGHT hip total arthroplasty. Surgical drain noted. Prosthetic components appear in proper orientation. No fracture IMPRESSION: No complication following RIGHT hip arthroplasty. Electronically Signed   By: Genevive Bi M.D.   On: 08/04/2016 09:59    Disposition: Final discharge disposition not confirmed       Signed: Amador Cunas CHRISTOPHER 08/06/2016, 7:18 AM

## 2016-08-06 NOTE — Progress Notes (Signed)
   Subjective: 2 Days Post-Op Procedure(s) (LRB): TOTAL HIP ARTHROPLASTY ANTERIOR APPROACH (Right) Patient reports pain as mild.   Patient is doing well, complaining of gas pains abdomen. Passing gas.  Denies any CP, SOB, ABD pain. We will continue therapy today.  Plan is to go Home after hospital stay.  Objective: Vital signs in last 24 hours: Temp:  [98.1 F (36.7 C)-99.8 F (37.7 C)] 98.5 F (36.9 C) (06/20 2251) Pulse Rate:  [83-88] 84 (06/20 1547) Resp:  [16-18] 16 (06/20 1547) BP: (129-161)/(80-92) 147/80 (06/20 2251) SpO2:  [100 %] 100 % (06/20 2251)  Intake/Output from previous day: 06/20 0701 - 06/21 0700 In: 240 [P.O.:240] Out: 2280 [Urine:2280] Intake/Output this shift: No intake/output data recorded.   Recent Labs  08/04/16 1115 08/05/16 0446 08/06/16 0423  HGB 11.9* 12.4* 11.6*    Recent Labs  08/05/16 0446 08/06/16 0423  WBC 7.9 6.9  RBC 4.53 4.21*  HCT 37.2* 34.8*  PLT 205 166    Recent Labs  08/05/16 0446 08/06/16 0423  NA 135 135  K 3.3* 3.5  CL 98* 100*  CO2 31 31  BUN 6 7  CREATININE 0.66 0.70  GLUCOSE 148* 123*  CALCIUM 8.5* 8.4*   No results for input(s): LABPT, INR in the last 72 hours.  EXAM General - Patient is Alert, Appropriate and Oriented Extremity - Neurovascular intact Sensation intact distally Intact pulses distally Dorsiflexion/Plantar flexion intact No cellulitis present Compartment soft Dressing - dressing C/D/I and no drainage , wound vac intact Motor Function - intact, moving foot and toes well on exam.   Past Medical History:  Diagnosis Date  . Arthritis   . Hypothyroidism     Assessment/Plan:   2 Days Post-Op Procedure(s) (LRB): TOTAL HIP ARTHROPLASTY ANTERIOR APPROACH (Right) Active Problems:   Primary localized osteoarthritis of right hip   hypokalemia Estimated body mass index is 33.75 kg/m as calculated from the following:   Height as of this encounter: 5\' 11"  (1.803 m).   Weight as of  this encounter: 109.8 kg (242 lb). Advance diet Up with therapy  Discharge home with HHPT F/U [facility] in 2 weeks     DVT Prophylaxis - Lovenox, Foot Pumps and TED hose Weight-Bearing as tolerated to right leg   T. Cranston Neighborhris Sacora Hawbaker, PA-C [facility] 08/06/2016, 7:08 AM

## 2016-08-06 NOTE — Care Management Note (Signed)
Case Management Note  Patient Details  Name: Lee Short MRN: [id number] Date of Birth: 06/02/1960  Subjective/Objective:  Discharging today                 Action/Plan: [facility] notified of discharge. Patient has already picked up Lovenox at cost of $10.00. DME delivered.   Expected Discharge Date:  08/06/16               Expected Discharge Plan:  Home w Home Health Services  In-House Referral:     Discharge planning Services  CM Consult  Post Acute Care Choice:  Durable Medical Equipment, Home Health Choice offered to:  Patient  DME Arranged:  Walker rolling DME Agency:  [facility]  HH Arranged:  PT HH Agency[facility]  [facility]  Status of Service:  Completed, signed off  If discussed at [employer] Length of Stay Meetings, dates discussed:    Additional Comments:  Marily MemosLisa M Barak Bialecki, RN 08/06/2016, 9:11 AM

## 2019-01-23 IMAGING — DX DG HIP (WITH OR WITHOUT PELVIS) 2-3V*R*
2 series · 2 of 2 positions shown · non-contrast
Comparison: None.

CLINICAL DATA: RIGHT hip postop

EXAM:
DG HIP (WITH OR WITHOUT PELVIS) 2-3V RIGHT

[hip ap]
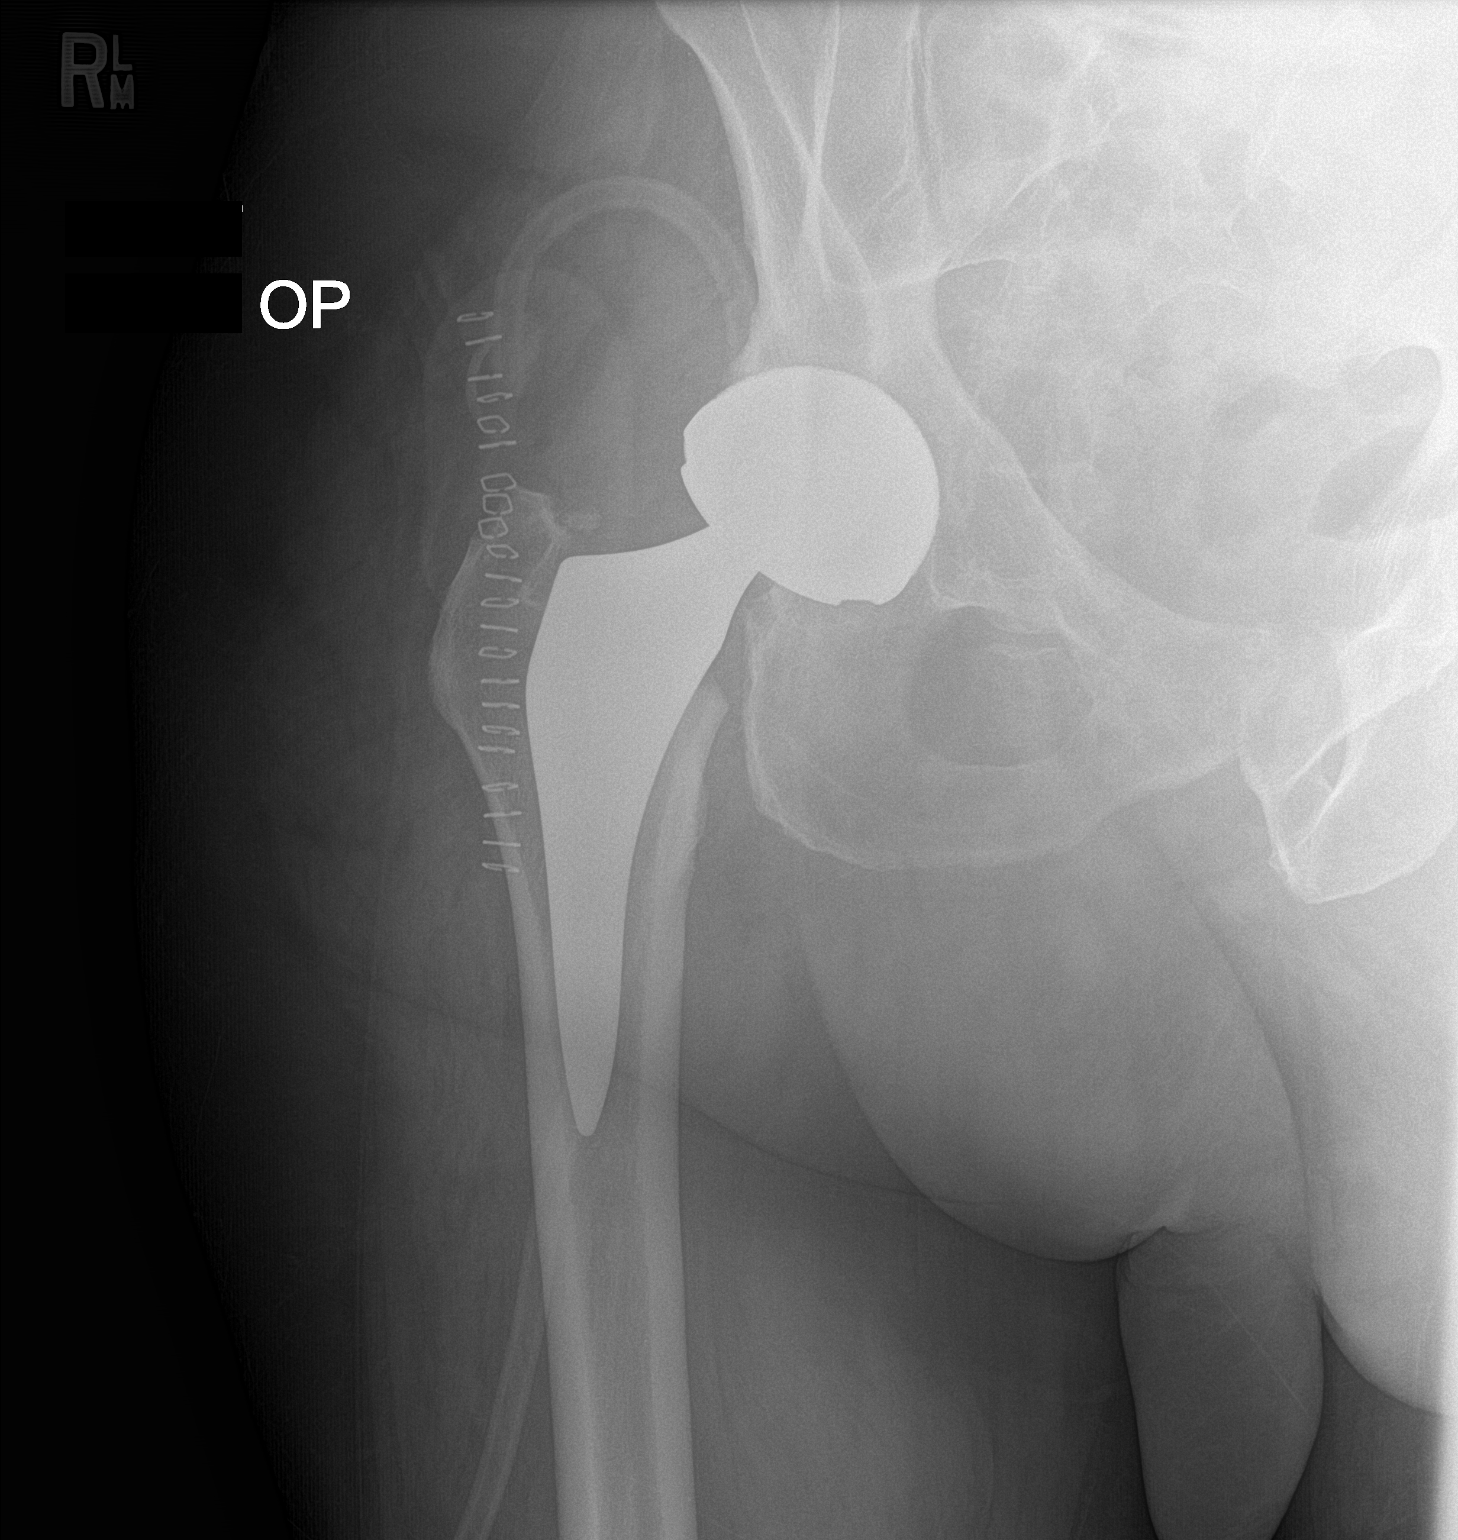

[hip lat]
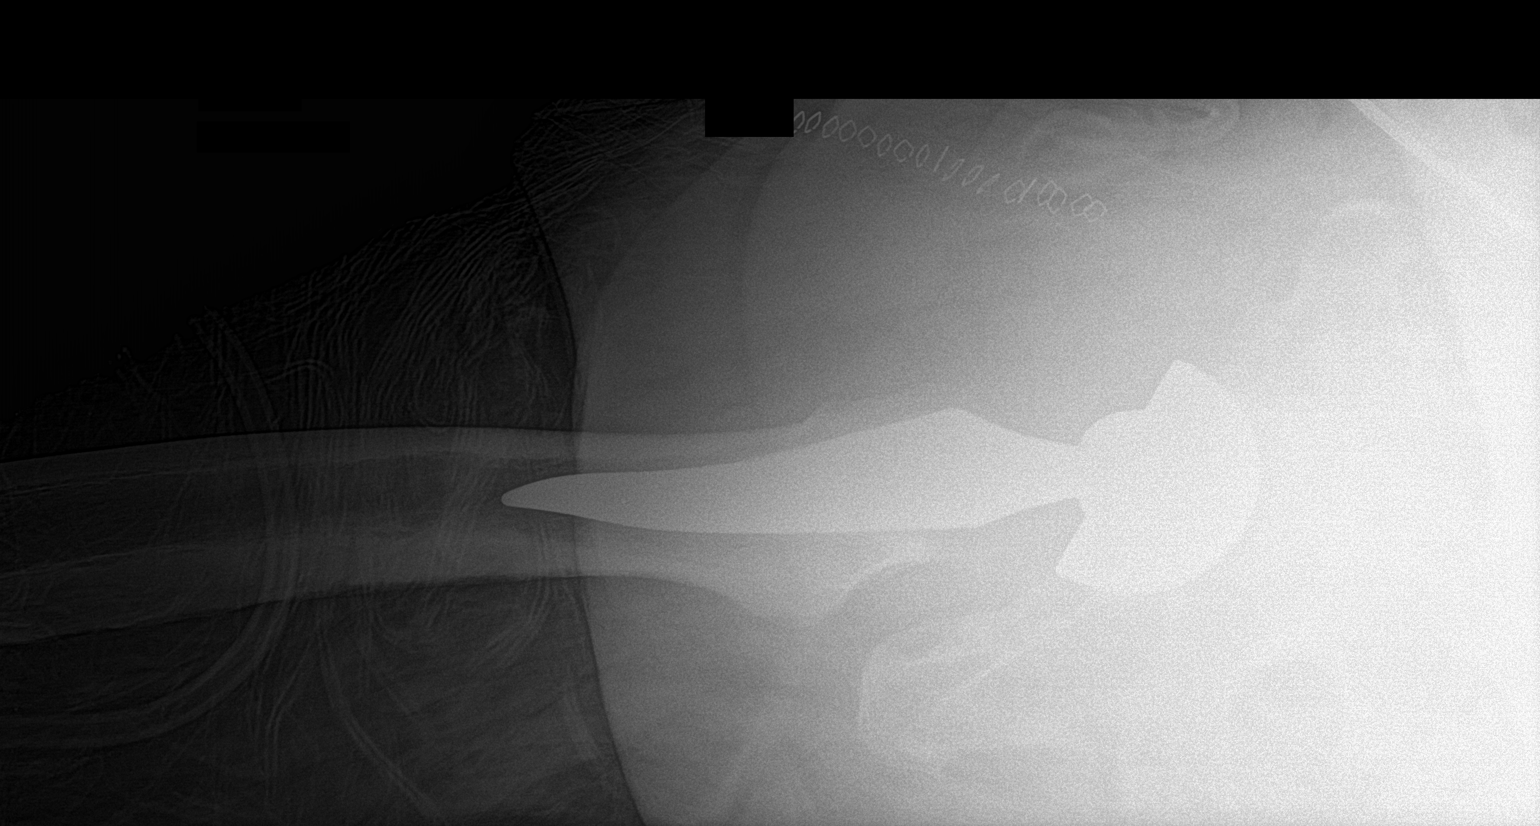

[2 of 2 positions shown; findings below may reference images not displayed]

FINDINGS: RIGHT hip total arthroplasty. Surgical drain noted. Prosthetic
components appear in proper orientation. No fracture
IMPRESSION: No complication following RIGHT hip arthroplasty.

## 2020-01-02 ENCOUNTER — Ambulatory Visit
Payer: No Typology Code available for payment source | Attending: Student in an Organized Health Care Education/Training Program | Admitting: Student in an Organized Health Care Education/Training Program

## 2020-01-02 ENCOUNTER — Encounter: Payer: Self-pay | Admitting: Student in an Organized Health Care Education/Training Program

## 2020-01-02 ENCOUNTER — Other Ambulatory Visit: Payer: Self-pay

## 2020-01-02 VITALS — BP 135/87 | HR 71 | Temp 98.0°F | Resp 18 | Ht 71.0 in | Wt 225.0 lb

## 2020-01-02 DIAGNOSIS — M5416 Radiculopathy, lumbar region: Secondary | ICD-10-CM

## 2020-01-02 DIAGNOSIS — G8929 Other chronic pain: Secondary | ICD-10-CM | POA: Diagnosis present

## 2020-01-02 DIAGNOSIS — M48061 Spinal stenosis, lumbar region without neurogenic claudication: Secondary | ICD-10-CM

## 2020-01-02 DIAGNOSIS — M5126 Other intervertebral disc displacement, lumbar region: Secondary | ICD-10-CM

## 2020-01-02 DIAGNOSIS — G894 Chronic pain syndrome: Secondary | ICD-10-CM | POA: Diagnosis present

## 2020-01-02 DIAGNOSIS — M47816 Spondylosis without myelopathy or radiculopathy, lumbar region: Secondary | ICD-10-CM

## 2020-01-02 MED ORDER — GABAPENTIN 300 MG PO CAPS: ORAL_CAPSULE | ORAL | 0 refills | Status: DC

## 2020-01-02 MED ORDER — DICLOFENAC SODIUM 75 MG PO TBEC: 75.0000 mg | DELAYED_RELEASE_TABLET | Freq: Two times a day (BID) | ORAL | 0 refills | Status: DC

## 2020-01-02 NOTE — Patient Instructions (Signed)

## 2020-01-02 NOTE — Progress Notes (Signed)
Safety precautions to be maintained throughout the outpatient stay will include: orient to surroundings, keep bed in low position, maintain call bell within reach at all times, provide assistance with transfer out of bed and ambulation.  

## 2020-01-02 NOTE — Progress Notes (Signed)
Patient: Lee Short  Service Category: E/M  Provider: Gillis Santa, MD  DOB: 1960-11-23  DOS: 01/02/2020  Referring Provider: [facility]  MRN: [id number]  Setting: Ambulatory outpatient  PCP: Kirk Ruths, MD  Type: New Patient  Specialty: Interventional Pain Management    Location: Office  Delivery: Face-to-face     Primary Reason(s) for Visit: Encounter for initial evaluation of one or more chronic problems (new to examiner) potentially causing chronic pain, and posing a threat to normal musculoskeletal function. (Level of risk: High) CC: Back Pain (low)  HPI  Lee Short is a 59 y.o. year old, male patient, who comes for the first time to our practice referred by [facility] for our initial evaluation of his chronic pain. He has Primary localized osteoarthritis of right hip; Neuroforaminal stenosis of lumbar spine; Chronic radicular lumbar pain; Herniation of lumbar intervertebral disc; Lumbar spondylosis; and Chronic pain syndrome on their problem list. Today he comes in for evaluation of his Back Pain (low)  Pain Assessment: Location: Lower Back Radiating: denies Onset: More than a month ago Duration: Chronic pain Quality: Larence Penning Severity: 4 /10 (subjective, self-reported pain score)  Effect on ADL: limits activities Timing: Constant Modifying factors: sitting in recliner with heating pad, medication BP: 135/87  HR: 71  Onset and Duration: Gradual and Present longer than 3 months Cause of pain: Unknown Severity: No change since onset Timing: Morning and Night Aggravating Factors: Bending, Prolonged sitting and Prolonged standing Alleviating Factors: Hot packs and Medications Associated Problems: Pain that wakes patient up and Pain that does not allow patient to sleep Quality of Pain: Agonizing, Annoying, Exhausting, Shooting and Throbbing Previous Examinations or Tests: MRI scan Previous Treatments: The patient denies none listed  Lee Short  is a very pleasant 59 year old male veteran, previously in the [employer], with a history of bilateral hip and bilateral knee replacement who presents with a chief complaint of low back pain with radiation to his upper buttocks as well as intermittent radiation to his anterior, abdominal region.  The pain has been going on for many years.  It has gotten worse.  Patient has done physical therapy in the past and continues to perform physical therapy exercises at home.  He has tried ibuprofen as well as acetaminophen in the past with limited benefit.  He denies having tried gabapentin, Lyrica, Cymbalta.  He also has a history of hypothyroidism currently on Synthroid.  Patient tries to do home stretching exercises at home.  He states that the pain makes it difficult for him to sleep and he does wake up at night with low back pain.  He does not have too much pain when moving or walking.  Meds   Current Outpatient Medications:  .  doxycycline (DORYX) 100 MG EC tablet, Take 100 mg by mouth 2 (two) times daily., Disp: , Rfl:  .  levothyroxine (SYNTHROID) 125 MCG tablet, Take 125 mcg by mouth daily before breakfast., Disp: , Rfl:  .  methocarbamol (ROBAXIN) 500 MG tablet, Take 1 tablet (500 mg total) by mouth every 6 (six) hours as needed for muscle spasms., Disp: 30 tablet, Rfl: 0 .  montelukast (SINGULAIR) 10 MG tablet, Take by mouth., Disp: , Rfl:  .  diclofenac (VOLTAREN) 75 MG EC tablet, Take 1 tablet (75 mg total) by mouth 2 (two) times daily., Disp: 60 tablet, Rfl: 0 .  gabapentin (NEURONTIN) 300 MG capsule, Take 1 capsule (300 mg total) by mouth at bedtime for 20 days, THEN 2 capsules (  600 mg total) at bedtime for 20 days., Disp: 60 capsule, Rfl: 0  Imaging Review  Lumbar MRI: June 13, 2019 L1-L2: Broad-based disc bulge with small central and left subarticular disc extrusion tracking a few millimeter inferiorly L2-L3: Broad-based disc bulge, ligamentum flavum and facet hypertrophy, mild bilateral  neuroforaminal stenosis L3-L4: Broad-based disc bulge, ligamentum flavum and facet hypertrophy.  Prominent dorsal epidural fat.  Moderate left and mild right neuroforaminal stenosis. L4-L5: Left eccentric disc bulge, ligamentum flavum and facet hypertrophy, prominent dorsal epidural fat.  Mild canal stenosis.  Advanced left and mild to moderate right neuroforaminal stenosis. L5-S1: Broad-based disc bulge and facet hypertrophy.  Advanced right and mild left neuroforaminal stenosis. Impression: Multilevel lumbar spondylosis and facet arthropathy as described   Hip-R DG 2-3 views: Results for orders placed during the hospital encounter of 08/04/16  DG HIP UNILAT W OR W/O PELVIS 2-3 VIEWS RIGHT  Narrative CLINICAL DATA:  RIGHT hip postop  EXAM: DG HIP (WITH OR WITHOUT PELVIS) 2-3V RIGHT  COMPARISON:  None.  FINDINGS: RIGHT hip total arthroplasty. Surgical drain noted. Prosthetic components appear in proper orientation. No fracture  IMPRESSION: No complication following RIGHT hip arthroplasty.   Electronically Signed By: Suzy Bouchard M.D. On: 08/04/2016 09:59   Complexity Note: Imaging results reviewed. Results shared with Mr. Rehman, using Layman's terms.                         ROS  Cardiovascular: Needs antibiotics prior to dental procedures Pulmonary or Respiratory: No reported pulmonary signs or symptoms such as wheezing and difficulty taking a deep full breath (Asthma), difficulty blowing air out (Emphysema), coughing up mucus (Bronchitis), persistent dry cough, or temporary stoppage of breathing during sleep Neurological: No reported neurological signs or symptoms such as seizures, abnormal skin sensations, urinary and/or fecal incontinence, being born with an abnormal open spine and/or a tethered spinal cord Psychological-Psychiatric: No reported psychological or psychiatric signs or symptoms such as difficulty sleeping, anxiety, depression, delusions or hallucinations  (schizophrenial), mood swings (bipolar disorders) or suicidal ideations or attempts Gastrointestinal: No reported gastrointestinal signs or symptoms such as vomiting or evacuating blood, reflux, heartburn, alternating episodes of diarrhea and constipation, inflamed or scarred liver, or pancreas or irrregular and/or infrequent bowel movements Genitourinary: No reported renal or genitourinary signs or symptoms such as difficulty voiding or producing urine, peeing blood, non-functioning kidney, kidney stones, difficulty emptying the bladder, difficulty controlling the flow of urine, or chronic kidney disease Hematological: No reported hematological signs or symptoms such as prolonged bleeding, low or poor functioning platelets, bruising or bleeding easily, hereditary bleeding problems, low energy levels due to low hemoglobin or being anemic Endocrine: Slow thyroid Rheumatologic: No reported rheumatological signs and symptoms such as fatigue, joint pain, tenderness, swelling, redness, heat, stiffness, decreased range of motion, with or without associated rash Musculoskeletal: Negative for myasthenia gravis, muscular dystrophy, multiple sclerosis or malignant hyperthermia Work History: Working full time  Allergies  Mr. Herberger has No Known Allergies.  Laboratory Chemistry Profile   Renal Lab Results  Component Value Date   BUN 7 08/06/2016   CREATININE 0.70 08/06/2016   GFRAA >60 08/06/2016   GFRNONAA >60 08/06/2016   PROTEINUR NEGATIVE 07/22/2016     Electrolytes Lab Results  Component Value Date   NA 135 08/06/2016   K 3.5 08/06/2016   CL 100 (L) 08/06/2016   CALCIUM 8.4 (L) 08/06/2016     Hepatic No results found for: AST, ALT, ALBUMIN, ALKPHOS, AMYLASE,  LIPASE, AMMONIA   ID Lab Results  Component Value Date   STAPHAUREUS NEGATIVE 07/22/2016   MRSAPCR NEGATIVE 07/22/2016     Bone No results found for: VD25OH, [id number], KC1275TZ0, YF7494WH6, 25OHVITD1, 25OHVITD2, 25OHVITD3,  TESTOFREE, TESTOSTERONE   Endocrine Lab Results  Component Value Date   GLUCOSE 123 (H) 08/06/2016   GLUCOSEU NEGATIVE 07/22/2016     Neuropathy No results found for: VITAMINB12, FOLATE, HGBA1C, HIV   CNS No results found for: COLORCSF, APPEARCSF, RBCCOUNTCSF, WBCCSF, POLYSCSF, LYMPHSCSF, EOSCSF, PROTEINCSF, GLUCCSF, JCVIRUS, CSFOLI, IGGCSF, LABACHR, ACETBL, LABACHR, ACETBL   Inflammation (CRP: Acute  ESR: Chronic) Lab Results  Component Value Date   ESRSEDRATE 1 07/22/2016     Rheumatology No results found for: RF, ANA, LABURIC, URICUR, LYMEIGGIGMAB, LYMEABIGMQN, HLAB27   Coagulation Lab Results  Component Value Date   INR 1.00 07/22/2016   LABPROT 13.2 07/22/2016   APTT 26 07/22/2016   PLT 166 08/06/2016     Cardiovascular Lab Results  Component Value Date   HGB 11.6 (L) 08/06/2016   HCT 34.8 (L) 08/06/2016     Screening Lab Results  Component Value Date   STAPHAUREUS NEGATIVE 07/22/2016   MRSAPCR NEGATIVE 07/22/2016     Cancer No results found for: CEA, CA125, LABCA2   Allergens No results found for: ALMOND, APPLE, ASPARAGUS, AVOCADO, BANANA, BARLEY, BASIL, BAYLEAF, GREENBEAN, LIMABEAN, WHITEBEAN, BEEFIGE, REDBEET, BLUEBERRY, BROCCOLI, CABBAGE, MELON, CARROT, CASEIN, CASHEWNUT, CAULIFLOWER, CELERY     Note: Lab results reviewed.  [place]  Drug: Mr. Beegle  reports no history of drug use. Alcohol:  reports current alcohol use of about 14.0 standard drinks of alcohol per week. Tobacco:  reports that he has never smoked. He has never used smokeless tobacco. Medical:  has a past medical history of Arthritis and Hypothyroidism. Family: family history is not on file.  Past Surgical History:  Procedure Laterality Date  . AMPUTATION FINGER Right   . ANKLE ARTHROSCOPY WITH REPAIR SUBLUXING TENDON Right   . JOINT REPLACEMENT     bilateral TKR  . ROTATOR CUFF REPAIR Right   . TENDON REPAIR Left    bicept  . TOTAL HIP ARTHROPLASTY Right 08/04/2016   Procedure:  TOTAL HIP ARTHROPLASTY ANTERIOR APPROACH;  Surgeon: Hessie Knows, MD;  Location: [facility] OR[facility];  Service: Orthopedics;  Laterality: Right;  . TOTAL KNEE ARTHROPLASTY Bilateral    Active Ambulatory Problems    Diagnosis Date Noted  . Primary localized osteoarthritis of right hip 08/04/2016  . Neuroforaminal stenosis of lumbar spine 01/02/2020  . Chronic radicular lumbar pain 01/02/2020  . Herniation of lumbar intervertebral disc 01/02/2020  . Lumbar spondylosis 01/02/2020  . Chronic pain syndrome 01/02/2020   Resolved Ambulatory Problems    Diagnosis Date Noted  . No Resolved Ambulatory Problems   Past Medical History:  Diagnosis Date  . Arthritis   . Hypothyroidism    Constitutional Exam  General appearance: Well nourished, well developed, and well hydrated. In no apparent acute distress Vitals:   01/02/20 0849  BP: 135/87  Pulse: 71  Resp: 18  Temp: 98 F (36.7 C)  SpO2: 98%  Weight: 225 lb (102.1 kg)  Height: _0  (1.803 m)   BMI Assessment: Estimated body mass index is 31.38 kg/m as calculated from the following:   Height as of this encounter: _1  (1.803 m).   Weight as of this encounter: 225 lb (102.1 kg).  BMI interpretation table: BMI level Category Range association with higher incidence of chronic pain  <18 kg/m2 Underweight  18.5-24.9 kg/m2 Ideal body weight   25-29.9 kg/m2 Overweight Increased incidence by 20%  30-34.9 kg/m2 Obese (Class I) Increased incidence by 68%  35-39.9 kg/m2 Severe obesity (Class II) Increased incidence by 136%  >40 kg/m2 Extreme obesity (Class III) Increased incidence by 254%   Patient's current BMI Ideal Body weight  Body mass index is 31.38 kg/m. Ideal body weight: 75.3 kg (166 lb 0.1 oz) Adjusted ideal body weight: 86 kg (189 lb 9.7 oz)   BMI Readings from Last 4 Encounters:  01/02/20 31.38 kg/m  08/04/16 33.75 kg/m  07/22/16 33.47 kg/m   Wt Readings from Last 4 Encounters:  01/02/20 225 lb (102.1 kg)  08/04/16 242  lb (109.8 kg)  07/22/16 240 lb (108.9 kg)    Psych/Mental status: Alert, oriented x 3 (person, place, & time)       Eyes: PERLA Respiratory: No evidence of acute respiratory distress  Cervical Spine Exam  Skin & Axial Inspection: No masses, redness, edema, swelling, or associated skin lesions Alignment: Symmetrical Functional ROM: Unrestricted ROM      Stability: No instability detected Muscle Tone/Strength: Functionally intact. No obvious neuro-muscular anomalies detected. Sensory (Neurological): Unimpaired Palpation: No palpable anomalies              Upper Extremity (UE) Exam    Side: Right upper extremity  Side: Left upper extremity  Skin & Extremity Inspection: Skin color, temperature, and hair growth are WNL. No peripheral edema or cyanosis. No masses, redness, swelling, asymmetry, or associated skin lesions. No contractures.  Skin & Extremity Inspection: Skin color, temperature, and hair growth are WNL. No peripheral edema or cyanosis. No masses, redness, swelling, asymmetry, or associated skin lesions. No contractures.  Functional ROM: Unrestricted ROM          Functional ROM: Unrestricted ROM          Muscle Tone/Strength: Functionally intact. No obvious neuro-muscular anomalies detected.   Muscle Tone/Strength: Functionally intact. No obvious neuro-muscular anomalies detected.  Sensory (Neurological): Unimpaired          Sensory (Neurological): Unimpaired          Palpation: No palpable anomalies              Palpation: No palpable anomalies              Provocative Test(s):  Phalen's test: deferred Tinel's test: deferred Apley's scratch test (touch opposite shoulder):  Action 1 (Across chest): deferred Action 2 (Overhead): deferred Action 3 (LB reach): deferred   Provocative Test(s):  Phalen's test: deferred Tinel's test: deferred Apley's scratch test (touch opposite shoulder):  Action 1 (Across chest): deferred Action 2 (Overhead): deferred Action 3 (LB reach):  deferred    Thoracic Spine Area Exam  Skin & Axial Inspection: No masses, redness, or swelling Alignment: Symmetrical Functional ROM: Unrestricted ROM Stability: No instability detected Muscle Tone/Strength: Functionally intact. No obvious neuro-muscular anomalies detected. Sensory (Neurological): Unimpaired Muscle strength & Tone: No palpable anomalies  Lumbar Exam  Skin & Axial Inspection: No masses, redness, or swelling Alignment: Symmetrical Functional ROM: Unrestricted ROM       Stability: No instability detected Muscle Tone/Strength: Functionally intact. No obvious neuro-muscular anomalies detected. Sensory (Neurological): Unimpaired Palpation: No palpable anomalies       Provocative Tests: Hyperextension/rotation test: deferred today       Lumbar quadrant test (Kemp's test): deferred today       Lateral bending test: deferred today       Patrick's Maneuver: deferred today  FABER* test: deferred today                   S-I anterior distraction/compression test: deferred today         S-I lateral compression test: deferred today         S-I Thigh-thrust test: deferred today         S-I Gaenslen's test: deferred today         *(Flexion, ABduction and External Rotation)  Gait & Posture Assessment  Ambulation: Unassisted Gait: Relatively normal for age and body habitus Posture: WNL   Lower Extremity Exam    Side: Right lower extremity  Side: Left lower extremity  Stability: No instability observed          Stability: No instability observed          Skin & Extremity Inspection: Skin color, temperature, and hair growth are WNL. No peripheral edema or cyanosis. No masses, redness, swelling, asymmetry, or associated skin lesions. No contractures.  Skin & Extremity Inspection: Skin color, temperature, and hair growth are WNL. No peripheral edema or cyanosis. No masses, redness, swelling, asymmetry, or associated skin lesions. No contractures.  Functional ROM:  Unrestricted ROM                  Functional ROM: Unrestricted ROM                  Muscle Tone/Strength: Functionally intact. No obvious neuro-muscular anomalies detected.  Muscle Tone/Strength: Functionally intact. No obvious neuro-muscular anomalies detected.  Sensory (Neurological): Unimpaired        Sensory (Neurological): Unimpaired        DTR: Patellar: deferred today Achilles: deferred today Plantar: deferred today  DTR: Patellar: deferred today Achilles: deferred today Plantar: deferred today  Palpation: No palpable anomalies  Palpation: No palpable anomalies   Assessment  Primary Diagnosis & Pertinent Problem List: The primary encounter diagnosis was Neuroforaminal stenosis of lumbar spine. Diagnoses of Chronic radicular lumbar pain, Herniation of lumbar intervertebral disc, Lumbar facet arthropathy, Lumbar spondylosis, and Chronic pain syndrome were also pertinent to this visit.  Visit Diagnosis (New problems to examiner): 1. Neuroforaminal stenosis of lumbar spine   2. Chronic radicular lumbar pain   3. Herniation of lumbar intervertebral disc   4. Lumbar facet arthropathy   5. Lumbar spondylosis   6. Chronic pain syndrome    Plan of Care (Initial workup plan)  Note: Mr. Wiberg was reminded that as per protocol, today's visit has been an evaluation only. We have not taken over the patient's controlled substance management.  General Recommendations: The pain condition that the patient suffers from is best treated with a multidisciplinary approach that involves an increase in physical activity to prevent de-conditioning and worsening of the pain cycle, as well as psychological counseling (formal and/or informal) to address the co-morbid psychological affects of pain. Treatment will often involve judicious use of pain medications and interventional procedures to decrease the pain, allowing the patient to participate in the physical activity that will ultimately produce  long-lasting pain reductions. The goal of the multidisciplinary approach is to return the patient to a higher level of overall function and to restore their ability to perform activities of daily living.  1.  Continue with home physical therapy exercises 2.  Start gabapentin as below 3.  Start diclofenac as below.  Instructed to not take any other NSAIDs while on diclofenac. 4.  Follow-up in 4 weeks.  Discuss injection therapy at that time starting  with either L3-L4 epidural steroid injection or diagnostic lumbar facet medial branch nerve blocks.  Pharmacotherapy (current): Medications ordered:  Meds ordered this encounter  Medications  . gabapentin (NEURONTIN) 300 MG capsule    Sig: Take 1 capsule (300 mg total) by mouth at bedtime for 20 days, THEN 2 capsules (600 mg total) at bedtime for 20 days.    Dispense:  60 capsule    Refill:  0  . diclofenac (VOLTAREN) 75 MG EC tablet    Sig: Take 1 tablet (75 mg total) by mouth 2 (two) times daily.    Dispense:  60 tablet    Refill:  0   Medications administered during this visit: Lucio Edward had no medications administered during this visit.   Pharmacological management options:  Opioid Analgesics: The patient was informed that there is no guarantee that he would be a candidate for opioid analgesics. The decision will be made following CDC guidelines. This decision will be based on the results of diagnostic studies, as well as Mr. Moates risk profile.   Membrane stabilizer: Trial of gabapentin as above.  Consider Lyrica in future  Muscle relaxant: To be determined at a later time  NSAID: Trial of diclofenac as above  Other analgesic(s): To be determined at a later time   Interventional management options: Mr. [place] was informed that there is no guarantee that he would be a candidate for interventional therapies. The decision will be based on the results of diagnostic studies, as well as Mr. Every risk profile.   Procedure(s) under consideration:  Lumbar epidural steroid injection at L3-L4 Lumbar facet medial branch nerve blocks bilaterally at L2, L3, L4, L5   Provider-requested follow-up: Return in about 4 weeks (around 01/30/2020) for Medication Management, in person.  Future Appointments  Date Time Provider [place]  01/30/2020  4:00 PM Gillis Santa, MD [facility] None    Note by: Gillis Santa, MD Date: 01/02/2020; Time: 9:42 AM

## 2020-01-29 ENCOUNTER — Other Ambulatory Visit: Payer: Self-pay | Admitting: Student in an Organized Health Care Education/Training Program

## 2020-01-30 ENCOUNTER — Encounter: Payer: Self-pay | Admitting: Student in an Organized Health Care Education/Training Program

## 2020-01-30 ENCOUNTER — Ambulatory Visit
Payer: No Typology Code available for payment source | Attending: Student in an Organized Health Care Education/Training Program | Admitting: Student in an Organized Health Care Education/Training Program

## 2020-01-30 ENCOUNTER — Other Ambulatory Visit: Payer: Self-pay

## 2020-01-30 VITALS — BP 133/89 | HR 60 | Temp 97.5°F | Resp 18 | Ht 71.0 in | Wt 225.0 lb

## 2020-01-30 DIAGNOSIS — M5416 Radiculopathy, lumbar region: Secondary | ICD-10-CM | POA: Diagnosis present

## 2020-01-30 DIAGNOSIS — G8929 Other chronic pain: Secondary | ICD-10-CM | POA: Insufficient documentation

## 2020-01-30 DIAGNOSIS — M5126 Other intervertebral disc displacement, lumbar region: Secondary | ICD-10-CM | POA: Diagnosis present

## 2020-01-30 DIAGNOSIS — G894 Chronic pain syndrome: Secondary | ICD-10-CM | POA: Diagnosis present

## 2020-01-30 DIAGNOSIS — M48061 Spinal stenosis, lumbar region without neurogenic claudication: Secondary | ICD-10-CM | POA: Diagnosis present

## 2020-01-30 DIAGNOSIS — M47816 Spondylosis without myelopathy or radiculopathy, lumbar region: Secondary | ICD-10-CM

## 2020-01-30 MED ORDER — DICLOFENAC SODIUM 75 MG PO TBEC
75.0000 mg | DELAYED_RELEASE_TABLET | Freq: Two times a day (BID) | ORAL | 3 refills | Status: DC
Start: 2020-01-30 — End: 2020-04-11

## 2020-01-30 MED ORDER — GABAPENTIN 300 MG PO CAPS: ORAL_CAPSULE | ORAL | 3 refills | Status: DC

## 2020-01-30 NOTE — Progress Notes (Signed)
Safety precautions to be maintained throughout the outpatient stay will include: orient to surroundings, keep bed in low position, maintain call bell within reach at all times, provide assistance with transfer out of bed and ambulation.   Nursing Pain Medication Assessment:  Safety precautions to be maintained throughout the outpatient stay will include: orient to surroundings, keep bed in low position, maintain call bell within reach at all times, provide assistance with transfer out of bed and ambulation.

## 2020-01-30 NOTE — Progress Notes (Signed)
PROVIDER NOTE: Information contained herein reflects review and annotations entered in association with encounter. Interpretation of such information and data should be left to medically-trained personnel. Information provided to patient can be located elsewhere in the medical record under "Patient Instructions". Document created using STT-dictation technology, any transcriptional errors that may result from process are unintentional.    Patient: Lee Short  Service Category: E/M  Provider: Gillis Santa, MD  DOB: [birth year]  DOS: 01/30/2020  Specialty: Interventional Pain Management  MRN: [id number]  Setting: Ambulatory outpatient  [facility]P: [facility]  Type: Established Patient    Referring Provider: Kirk Ruths, MD  Location: Office  Delivery: Face-to-face     HPI  [name], a 59 y.o. 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 Mr. Farrior primary complain today is Medication Refill Last encounter: My last encounter with him was on 01/29/2020. Pertinent problems: Lee Short has Primary localized osteoarthritis of right hip; Neuroforaminal stenosis of lumbar spine; Chronic radicular lumbar pain; Herniation of lumbar intervertebral disc; Lumbar spondylosis; and Chronic pain syndrome on their pertinent problem list. Pain Assessment: Severity of Chronic pain is reported as a 3 /10. Location: Back Lower/Denies. Onset: More than a month ago. Quality: Constant,Aching. Timing: Constant. Modifying factor(s): Gabapetin at night makes pain better in the morning. Vitals:  height is 5' 11"  (1.803 m) and weight is 225 lb (102.1 kg). His temporal temperature is 97.5 F (36.4 C) (abnormal). His blood pressure is 133/89 and his pulse is 60. His respiration is 18 and oxygen saturation is 100%.   Reason for encounter: medication management.   Lee Short presents today for medication management.   He is doing well with his gabapentin  at 300 mg in the morning and 600 mg at night.  He states that he is sleeping better and has less pain when he wakes up.  He also utilizes diclofenac 75 mg twice daily.  Given that his pain is well managed, we will defer any injections at this time but I did inform Lee Short that they were still a option if his pain were to worsen.  He endorsed understanding.  He is looking forward to having his daughter back from medical school for winter break later this month.   ROS  Constitutional: Denies any fever or chills Gastrointestinal: No reported hemesis, hematochezia, vomiting, or acute GI distress Musculoskeletal: Denies any acute onset joint swelling, redness, loss of ROM, or weakness Neurological: No reported episodes of acute onset apraxia, aphasia, dysarthria, agnosia, amnesia, paralysis, loss of coordination, or loss of consciousness  Medication Review  diclofenac, doxycycline, gabapentin, levothyroxine, methocarbamol, and montelukast  History Review  Allergy: Lee Short has No Known Allergies. Drug: Lee Short  reports no history of drug use. Alcohol:  reports current alcohol use of about 14.0 standard drinks of alcohol per week. Tobacco:  reports that he has never smoked. He has never used smokeless tobacco. Social: Lee Short  reports that he has never smoked. He has never used smokeless tobacco. He reports current alcohol use of about 14.0 standard drinks of alcohol per week. He reports that he does not use drugs. Medical:  has a past medical history of Arthritis and Hypothyroidism. 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 Family: family history is not on file.  Laboratory Chemistry Profile   Renal Lab Results  Component  Value Date   BUN 7 08/06/2016   CREATININE 0.70 08/06/2016   GFRAA >60 08/06/2016   GFRNONAA >60 08/06/2016     Hepatic No results found for: AST, ALT, ALBUMIN, ALKPHOS, HCVAB, AMYLASE, LIPASE, AMMONIA   Electrolytes Lab Results  Component Value Date   NA 135 08/06/2016   K 3.5 08/06/2016   CL 100 (L) 08/06/2016   CALCIUM 8.4 (L) 08/06/2016     Bone No results found for: VD25OH, [id number], [id number], [id number], 25OHVITD1, 25OHVITD2, 25OHVITD3, TESTOFREE, TESTOSTERONE   Inflammation (CRP: Acute Phase) (ESR: Chronic Phase) Lab Results  Component Value Date   ESRSEDRATE 1 07/22/2016       Note: Above Lab results reviewed.  Recent Imaging Review  DG HIP UNILAT W OR W/O PELVIS 2-3 VIEWS RIGHT CLINICAL DATA:  RIGHT hip postop  EXAM: DG HIP (WITH OR WITHOUT PELVIS) 2-3V RIGHT  COMPARISON:  None.  FINDINGS: RIGHT hip total arthroplasty. Surgical drain noted. Prosthetic components appear in proper orientation. No fracture  IMPRESSION: No complication following RIGHT hip arthroplasty.  Electronically Signed   By: Suzy Bouchard M.D.   On: 08/04/2016 09:59 DG HIP OPERATIVE UNILAT W OR W/O PELVIS RIGHT CLINICAL DATA:  Right total hip prosthesis placement using anterior approach. 6 seconds of fluoro time reported.  EXAM: OPERATIVE right HIP (WITH PELVIS IF PERFORMED) 3 VIEWS  TECHNIQUE: Fluoroscopic spot image(s) were submitted for interpretation post-operatively.  COMPARISON:  None.  FINDINGS: The initial image reveals the preoperative appearance of the right hip. The subsequent images reveal the patient to have undergone placement of the prosthesis. Radiographic positioning of the prosthetic components is good.  IMPRESSION: The patient has undergone anterior approach right total hip joint prosthesis placement. There is no immediate postprocedure complication.  Electronically Signed   By: David  Martinique M.D.   On: 08/04/2016 09:08 Note: Reviewed         Physical Exam  General appearance: Well nourished, well developed, and well hydrated. In no apparent acute distress Mental status: Alert, oriented x 3 (person, place, & time)       Respiratory: No evidence of acute respiratory distress Eyes: PERLA Vitals: BP 133/89   Pulse 60   Temp (!) 97.5 F (36.4 C) (Temporal)   Resp 18   Ht 5' 11"  (1.803 m)   Wt 225 lb (102.1 kg)   SpO2 100%   BMI 31.38 kg/m  BMI: Estimated body mass index is 31.38 kg/m as calculated from the following:   Height as of this encounter: 5' 11"  (1.803 m).   Weight as of this encounter: 225 lb (102.1 kg). Ideal: Ideal body weight: 75.3 kg (166 lb 0.1 oz) Adjusted ideal body weight: 86 kg (189 lb 9.7 oz)  Lumbar Exam  Skin & Axial Inspection: No masses, redness, or swelling Alignment: Symmetrical Functional ROM: Unrestricted ROM       Stability: No instability detected Muscle Tone/Strength: Functionally intact. No obvious neuro-muscular anomalies detected. Sensory (Neurological): Unimpaired  Gait & Posture Assessment  Ambulation: Unassisted Gait: Relatively normal for age and body habitus Posture: WNL   Lower Extremity Exam    Side: Right lower extremity  Side: Left lower extremity  Stability: No instability observed          Stability: No instability observed          Skin & Extremity Inspection: Skin color, temperature, and hair growth are WNL. No peripheral edema or cyanosis. No masses, redness, swelling,  asymmetry, or associated skin lesions. No contractures.  Skin & Extremity Inspection: Skin color, temperature, and hair growth are WNL. No peripheral edema or cyanosis. No masses, redness, swelling, asymmetry, or associated skin lesions. No contractures.  Functional ROM: Unrestricted ROM                  Functional ROM: Unrestricted ROM                  Muscle Tone/Strength: Functionally intact. No obvious neuro-muscular anomalies detected.  Muscle Tone/Strength: Functionally intact. No  obvious neuro-muscular anomalies detected.  Sensory (Neurological): Unimpaired        Sensory (Neurological): Unimpaired        DTR: Patellar: deferred today Achilles: deferred today Plantar: deferred today  DTR: Patellar: deferred today Achilles: deferred today Plantar: deferred today  Palpation: No palpable anomalies  Palpation: No palpable anomalies     Assessment   Status Diagnosis  Controlled Controlled Controlled 1. Neuroforaminal stenosis of lumbar spine   2. Chronic radicular lumbar pain   3. Herniation of lumbar intervertebral disc   4. Lumbar facet arthropathy   5. Lumbar spondylosis   6. Chronic pain syndrome      Updated Problems: Problem  Neuroforaminal Stenosis of Lumbar Spine  Chronic Radicular Lumbar Pain  Herniation of Lumbar Intervertebral Disc  Lumbar Spondylosis  Chronic Pain Syndrome  Primary Localized Osteoarthritis of Right Hip    Plan of Care  Mr. Levante Simones has a current medication list which includes the following long-term medication(s): levothyroxine, montelukast, and gabapentin.  Pharmacotherapy (Medications Ordered): Meds ordered this encounter  Medications  . gabapentin (NEURONTIN) 300 MG capsule    Sig: 300 mg qAM, 600 qHS    Dispense:  90 capsule    Refill:  3  . diclofenac (VOLTAREN) 75 MG EC tablet    Sig: Take 1 tablet (75 mg total) by mouth 2 (two) times daily.    Dispense:  60 tablet    Refill:  3   Follow-up plan:   Return in about 4 months (around 05/30/2020) for Medication Management, in person.   Recent Visits Date Type Provider Dept  01/02/20 Office Visit Gillis Santa, MD [facility]  Showing recent visits within past 90 days and meeting all other requirements Today's Visits Date Type Provider Dept  01/30/20 Office Visit Gillis Santa, MD [facility]  Showing today's visits and meeting all other requirements Future Appointments No visits were found meeting these conditions. Showing  future appointments within next 90 days and meeting all other requirements  I discussed the assessment and treatment plan with the patient. The patient was provided an opportunity to ask questions and all were answered. The patient agreed with the plan and demonstrated an understanding of the instructions.  Patient advised to call back or seek an in-person evaluation if the symptoms or condition worsens.  Duration of encounter: 25 minutes.  Note by: Gillis Santa, MD Date: 01/30/2020; Time: 10:46 AM

## 2020-04-11 ENCOUNTER — Telehealth: Payer: Self-pay

## 2020-04-11 MED ORDER — METHOCARBAMOL 500 MG PO TABS: 500.0000 mg | ORAL_TABLET | Freq: Four times a day (QID) | ORAL | 0 refills | Status: DC | PRN

## 2020-04-11 MED ORDER — GABAPENTIN 300 MG PO CAPS: ORAL_CAPSULE | ORAL | 3 refills | Status: DC

## 2020-04-11 MED ORDER — DICLOFENAC SODIUM 75 MG PO TBEC: 75.0000 mg | DELAYED_RELEASE_TABLET | Freq: Two times a day (BID) | ORAL | 3 refills | Status: DC

## 2020-04-11 NOTE — Telephone Encounter (Signed)
Pt needs copy of Rx to take to [place] [facility] to be filled due to cost of meds being high prices.   Gabapentin  Diclofenac

## 2020-04-11 NOTE — Telephone Encounter (Signed)
Requested Prescriptions   Signed Prescriptions Disp Refills  . diclofenac (VOLTAREN) 75 MG EC tablet 60 tablet 3    Sig: Take 1 tablet (75 mg total) by mouth 2 (two) times daily.    Authorizing Provider: Edward Jolly  . gabapentin (NEURONTIN) 300 MG capsule 90 capsule 3    Sig: 300 mg qAM, 600 qHS    Authorizing Provider: LATEEF, BILAL  . methocarbamol (ROBAXIN) 500 MG tablet 30 tablet 0    Sig: Take 1 tablet (500 mg total) by mouth every 6 (six) hours as needed for muscle spasms.    Authorizing Provider: Edward Jolly

## 2020-04-11 NOTE — Telephone Encounter (Signed)
Will you do this? I have changed his primary pharmacy to [facility]

## 2020-05-30 ENCOUNTER — Ambulatory Visit
Payer: No Typology Code available for payment source | Attending: Student in an Organized Health Care Education/Training Program | Admitting: Student in an Organized Health Care Education/Training Program

## 2020-05-30 ENCOUNTER — Encounter: Payer: Self-pay | Admitting: Student in an Organized Health Care Education/Training Program

## 2020-05-30 ENCOUNTER — Other Ambulatory Visit: Payer: Self-pay

## 2020-05-30 VITALS — BP 123/83 | HR 61 | Temp 97.2°F | Resp 16 | Ht 71.0 in | Wt 225.0 lb

## 2020-05-30 DIAGNOSIS — M5126 Other intervertebral disc displacement, lumbar region: Secondary | ICD-10-CM | POA: Diagnosis present

## 2020-05-30 DIAGNOSIS — M48061 Spinal stenosis, lumbar region without neurogenic claudication: Secondary | ICD-10-CM

## 2020-05-30 DIAGNOSIS — M47816 Spondylosis without myelopathy or radiculopathy, lumbar region: Secondary | ICD-10-CM | POA: Diagnosis present

## 2020-05-30 DIAGNOSIS — G8929 Other chronic pain: Secondary | ICD-10-CM

## 2020-05-30 DIAGNOSIS — M5416 Radiculopathy, lumbar region: Secondary | ICD-10-CM | POA: Diagnosis present

## 2020-05-30 MED ORDER — GABAPENTIN 300 MG PO CAPS: 300.0000 mg | ORAL_CAPSULE | Freq: Two times a day (BID) | ORAL | 5 refills | Status: DC

## 2020-05-30 MED ORDER — DICLOFENAC SODIUM 75 MG PO TBEC: 75.0000 mg | DELAYED_RELEASE_TABLET | Freq: Two times a day (BID) | ORAL | 5 refills | Status: DC

## 2020-05-30 NOTE — Progress Notes (Signed)
Safety precautions to be maintained throughout the outpatient stay will include: orient to surroundings, keep bed in low position, maintain call bell within reach at all times, provide assistance with transfer out of bed and ambulation.  

## 2020-05-30 NOTE — Progress Notes (Signed)
PROVIDER NOTE: Information contained herein reflects review and annotations entered in association with encounter. Interpretation of such information and data should be left to medically-trained personnel. Information provided to patient can be located elsewhere in the medical record under "Patient Instructions". Document created using STT-dictation technology, any transcriptional errors that may result from process are unintentional.    Patient: Lee Short  Service Category: E/M  Provider: Gillis Santa, MD  DOB: 02-09-1961  DOS: 05/30/2020  Specialty: Interventional Pain Management  MRN: [id number]  Setting: Ambulatory outpatient  [facility]P: [facility]  Type: Established Patient    Referring Provider: [facility]  Location: Office  Delivery: Face-to-face     HPI  [name], a 60 y.o. 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 [name] primary complain today is Back Pain (lower) Last encounter: My last encounter with him was on 01/30/2020. Pertinent problems: [name] has Primary localized osteoarthritis of right hip; Neuroforaminal stenosis of lumbar spine; Chronic radicular lumbar pain; Herniation of lumbar intervertebral disc; Lumbar spondylosis; and Chronic pain syndrome on their pertinent problem list. Pain Assessment: Severity of Chronic pain is reported as a 2 /10. Location: Back Lower/denies. Onset: More than a month ago. Quality: Aching. Timing: Constant. Modifying factor(s): meds. Vitals:  height is _0  (1.803 m) and weight is 225 lb (102.1 kg). His temporal temperature is 97.2 F (36.2 C) (abnormal). His blood pressure is 123/83 and his pulse is 61. His respiration is 16 and oxygen saturation is 99%.   Reason for encounter: medication management.    Overall patient is doing well, no significant change in his medical history.  He has reduced his gabapentin dose to 300 mg twice a day and he finds that  helpful.  Continues diclofenac twice a day as well.  We discussed risks and benefits of taking an NSAID chronically on a daily basis and how this could have downstream effect on his kidney and stomach lining.  I recommended that he use the lowest effective dose possible and if there is certain days that he can skip the diclofenac to do so and substitute extra strength acetaminophen, maximum daily dose 2.5 g.  ROS  Constitutional: Denies any fever or chills Gastrointestinal: No reported hemesis, hematochezia, vomiting, or acute GI distress Musculoskeletal: Low back pain Neurological: No reported episodes of acute onset apraxia, aphasia, dysarthria, agnosia, amnesia, paralysis, loss of coordination, or loss of consciousness  Medication Review  diclofenac, doxycycline, gabapentin, levothyroxine, and montelukast  History Review  Allergy: [name] has No Known Allergies. Drug: [name]  reports no history of drug use. Alcohol:  reports current alcohol use of about 14.0 standard drinks of alcohol per week. Tobacco:  reports that he has never smoked. He has never used smokeless tobacco. Social: [name]  reports that he has never smoked. He has never used smokeless tobacco. He reports current alcohol use of about 14.0 standard drinks of alcohol per week. He reports that he does not use drugs. Medical:  has a past medical history of Arthritis and Hypothyroidism. Surgical: [name]  has a past surgical history that includes Total knee arthroplasty (Bilateral); Rotator cuff repair (Right); Amputation finger (Right); Ankle arthroscopy with repair subluxing tendon (Right); Tendon repair (Left); Joint replacement; and Total hip arthroplasty (Right, 08/04/2016). Family: family history is not on file.  Laboratory Chemistry Profile   Renal Lab Results  Component Value Date   BUN 7 08/06/2016   CREATININE 0.70 08/06/2016  GFRAA >60 08/06/2016   GFRNONAA >60 08/06/2016     Hepatic No  results found for: AST, ALT, ALBUMIN, ALKPHOS, HCVAB, AMYLASE, LIPASE, AMMONIA   Electrolytes Lab Results  Component Value Date   NA 135 08/06/2016   K 3.5 08/06/2016   CL 100 (L) 08/06/2016   CALCIUM 8.4 (L) 08/06/2016     Bone No results found for: VD25OH, [id number], [id number], [id number], 25OHVITD1, 25OHVITD2, 25OHVITD3, TESTOFREE, TESTOSTERONE   Inflammation (CRP: Acute Phase) (ESR: Chronic Phase) Lab Results  Component Value Date   ESRSEDRATE 1 07/22/2016       Note: Above Lab results reviewed.  Recent Imaging Review  DG HIP UNILAT W OR W/O PELVIS 2-3 VIEWS RIGHT CLINICAL DATA:  RIGHT hip postop  EXAM: DG HIP (WITH OR WITHOUT PELVIS) 2-3V RIGHT  COMPARISON:  None.  FINDINGS: RIGHT hip total arthroplasty. Surgical drain noted. Prosthetic components appear in proper orientation. No fracture  IMPRESSION: No complication following RIGHT hip arthroplasty.  Electronically Signed   By: Suzy Bouchard M.D.   On: 08/04/2016 09:59 DG HIP OPERATIVE UNILAT W OR W/O PELVIS RIGHT CLINICAL DATA:  Right total hip prosthesis placement using anterior approach. 6 seconds of fluoro time reported.  EXAM: OPERATIVE right HIP (WITH PELVIS IF PERFORMED) 3 VIEWS  TECHNIQUE: Fluoroscopic spot image(s) were submitted for interpretation post-operatively.  COMPARISON:  None.  FINDINGS: The initial image reveals the preoperative appearance of the right hip. The subsequent images reveal the patient to have undergone placement of the prosthesis. Radiographic positioning of the prosthetic components is good.  IMPRESSION: The patient has undergone anterior approach right total hip joint prosthesis placement. There is no immediate postprocedure complication.  Electronically Signed   By: David  Martinique M.D.   On: 08/04/2016 09:08 Note: Reviewed        Physical Exam  General appearance: Well nourished, well developed, and well hydrated. In no apparent acute distress Mental  status: Alert, oriented x 3 (person, place, & time)       Respiratory: No evidence of acute respiratory distress Eyes: PERLA Vitals: BP 123/83   Pulse 61   Temp (!) 97.2 F (36.2 C) (Temporal)   Resp 16   Ht _0  (1.803 m)   Wt 225 lb (102.1 kg)   SpO2 99%   BMI 31.38 kg/m  BMI: Estimated body mass index is 31.38 kg/m as calculated from the following:   Height as of this encounter: _1  (1.803 m).   Weight as of this encounter: 225 lb (102.1 kg). Ideal: Ideal body weight: 75.3 kg (166 lb 0.1 oz) Adjusted ideal body weight: 86 kg (189 lb 9.7 oz)  5 out of 5 strength bilateral lower extremity: Plantar flexion, dorsiflexion, knee flexion, knee extension.   Assessment   Status Diagnosis  Controlled Controlled Controlled 1. Neuroforaminal stenosis of lumbar spine   2. Chronic radicular lumbar pain   3. Herniation of lumbar intervertebral disc   4. Lumbar facet arthropathy   5. Lumbar spondylosis       Plan of Care  Mr. Jaylun Fleener has a current medication list which includes the following long-term medication(s): levothyroxine, montelukast, and gabapentin.  Pharmacotherapy (Medications Ordered): Meds ordered this encounter  Medications  . gabapentin (NEURONTIN) 300 MG capsule    Sig: Take 1 capsule (300 mg total) by mouth 2 (two) times daily.    Dispense:  60 capsule    Refill:  5  . diclofenac (VOLTAREN) 75 MG EC tablet    Sig: Take 1 tablet (  75 mg total) by mouth 2 (two) times daily.    Dispense:  60 tablet    Refill:  5   Follow-up plan:   Return in about 6 months (around 11/29/2020) for Medication Management, in person.   Recent Visits No visits were found meeting these conditions. Showing recent visits within past 90 days and meeting all other requirements Today's Visits Date Type Provider Dept  05/30/20 Office Visit Gillis Santa, MD [facility]  Showing today's visits and meeting all other requirements Future Appointments No visits  were found meeting these conditions. Showing future appointments within next 90 days and meeting all other requirements  I discussed the assessment and treatment plan with the patient. The patient was provided an opportunity to ask questions and all were answered. The patient agreed with the plan and demonstrated an understanding of the instructions.  Patient advised to call back or seek an in-person evaluation if the symptoms or condition worsens.  Duration of encounter: 20 minutes.  Note by: Gillis Santa, MD Date: 05/30/2020; Time: 10:20 AM

## 2020-12-03 ENCOUNTER — Ambulatory Visit
Payer: No Typology Code available for payment source | Attending: Student in an Organized Health Care Education/Training Program | Admitting: Student in an Organized Health Care Education/Training Program

## 2020-12-03 ENCOUNTER — Other Ambulatory Visit: Payer: Self-pay

## 2020-12-03 DIAGNOSIS — M47816 Spondylosis without myelopathy or radiculopathy, lumbar region: Secondary | ICD-10-CM | POA: Diagnosis not present

## 2020-12-03 DIAGNOSIS — M5126 Other intervertebral disc displacement, lumbar region: Secondary | ICD-10-CM | POA: Diagnosis not present

## 2020-12-03 DIAGNOSIS — M48061 Spinal stenosis, lumbar region without neurogenic claudication: Secondary | ICD-10-CM

## 2020-12-03 DIAGNOSIS — M5416 Radiculopathy, lumbar region: Secondary | ICD-10-CM

## 2020-12-03 DIAGNOSIS — G8929 Other chronic pain: Secondary | ICD-10-CM

## 2020-12-03 DIAGNOSIS — G894 Chronic pain syndrome: Secondary | ICD-10-CM

## 2020-12-03 MED ORDER — DICLOFENAC SODIUM 75 MG PO TBEC: 75.0000 mg | DELAYED_RELEASE_TABLET | Freq: Two times a day (BID) | ORAL | 5 refills | Status: AC

## 2020-12-03 MED ORDER — GABAPENTIN 300 MG PO CAPS: 300.0000 mg | ORAL_CAPSULE | Freq: Two times a day (BID) | ORAL | 5 refills | Status: AC

## 2020-12-03 NOTE — Progress Notes (Signed)
Patient: Lee Short  Service Category: E/M  Provider: Edward Jolly, MD  DOB: 1960-12-22  DOS: 12/03/2020  Location: Office  MRN: [id number]  Setting: Ambulatory outpatient  Referring Provider: [facility]  Type: Established Patient  Specialty: Interventional Pain Management  PCP: [facility]  Location: Home  Delivery: TeleHealth     Virtual Encounter - Pain Management PROVIDER NOTE: Information contained herein reflects review and annotations entered in association with encounter. Interpretation of such information and data should be left to medically-trained personnel. Information provided to patient can be located elsewhere in the medical record under "Patient Instructions". Document created using STT-dictation technology, any transcriptional errors that may result from process are unintentional.    Contact & Pharmacy Preferred: [phone] Home: [phone] (home) Mobile: [phone] (mobile) E-mail: [id number] [place], [place] - [address] AT [facility] OF [address] [place] [address] Phone: [phone] Fax: [phone]  [facility] - [place], [place] - [address] [place] [address] Phone: [phone] Fax: [phone]   Pre-screening  Lee Short offered "in-person" vs "virtual" encounter. He indicated preferring virtual for this encounter.   Reason COVID-19*  Social distancing based on CDC and AMA recommendations.   I contacted Lee Short on 12/03/2020 via telephone.      I clearly identified myself as Lee Jolly, MD. I verified that I was speaking with the correct person using two identifiers (Name: Lee Short, and date of birth: Aug 20, 1960).  Consent I sought verbal advanced consent from Lee Short for virtual visit interactions. I informed Lee Short of possible security and  privacy concerns, risks, and limitations associated with providing "not-in-person" medical evaluation and management services. I also informed Lee Short of the availability of "in-person" appointments. Finally, I informed him that there would be a charge for the virtual visit and that he could be  personally, fully or partially, financially responsible for it. Lee Short expressed understanding and agreed to proceed.   Historic Elements   Lee Short is a 60 y.o. year old, male patient evaluated today after our last contact on 05/30/2020 Lee Short  has a past medical history of Arthritis and Hypothyroidism. 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 Lee Short has a current medication list which includes the following prescription(s): augmented betamethasone dipropionate, calcipotriene, doxycycline, levothyroxine, montelukast, omeprazole, sildenafil, diclofenac, and gabapentin. He  reports that he has never smoked. He has never used smokeless tobacco. He reports current alcohol use of about 14.0 standard drinks per week. He reports that he does not use drugs. Lee Short has No Known Allergies.   HPI  Today, he is being contacted for medication management.  Moving TV, tore 2 tendons in right his foot and had right foot surgery 10/11/20:  Procedures Performed:  1. Right triple arthrodesis with allograft 2. Right deltoid ligament reconstruction with internal brace augmentation 3. Right gastrocnemius recession 4. Right peroneal tendon repair 5. Right posterior tibial tendon excision 6. Bone marrow harvest, autologous, iliac crest   Med refill of Gabapentin Overall low back pain better Will be weight bearing in 2 weeks (week 9)  Laboratory  Chemistry Profile   Renal Lab Results  Component Value Date   BUN 7 08/06/2016  CREATININE 0.70 08/06/2016   GFRAA >60 08/06/2016   GFRNONAA >60 08/06/2016    Hepatic No results found for: AST, ALT, ALBUMIN, ALKPHOS, HCVAB, AMYLASE, LIPASE, AMMONIA  Electrolytes Lab Results  Component Value Date   NA 135 08/06/2016   K 3.5 08/06/2016   CL 100 (L) 08/06/2016   CALCIUM 8.4 (L) 08/06/2016    Bone No results found for: VD25OH, [id number], JW1191YN8, GN5621HY8, 25OHVITD1, 25OHVITD2, 25OHVITD3, TESTOFREE, TESTOSTERONE  Inflammation (CRP: Acute Phase) (ESR: Chronic Phase) Lab Results  Component Value Date   ESRSEDRATE 1 07/22/2016         Note: Above Lab results reviewed.  Imaging  DG HIP UNILAT W OR W/O PELVIS 2-3 VIEWS RIGHT CLINICAL DATA:  RIGHT hip postop  EXAM: DG HIP (WITH OR WITHOUT PELVIS) 2-3V RIGHT  COMPARISON:  None.  FINDINGS: RIGHT hip total arthroplasty. Surgical drain noted. Prosthetic components appear in proper orientation. No fracture  IMPRESSION: No complication following RIGHT hip arthroplasty.  Electronically Signed   By: Suzy Bouchard M.D.   On: 08/04/2016 09:59 DG HIP OPERATIVE UNILAT W OR W/O PELVIS RIGHT CLINICAL DATA:  Right total hip prosthesis placement using anterior approach. 6 seconds of fluoro time reported.  EXAM: OPERATIVE right HIP (WITH PELVIS IF PERFORMED) 3 VIEWS  TECHNIQUE: Fluoroscopic spot image(s) were submitted for interpretation post-operatively.  COMPARISON:  None.  FINDINGS: The initial image reveals the preoperative appearance of the right hip. The subsequent images reveal the patient to have undergone placement of the prosthesis. Radiographic positioning of the prosthetic components is good.  IMPRESSION: The patient has undergone anterior approach right total hip joint prosthesis placement. There is no immediate postprocedure complication.  Electronically Signed   By: David  Martinique M.D.   On:  08/04/2016 09:08  Assessment  The primary encounter diagnosis was Neuroforaminal stenosis of lumbar spine. Diagnoses of Chronic radicular lumbar pain, Herniation of lumbar intervertebral disc, Lumbar facet arthropathy, Lumbar spondylosis, and Chronic pain syndrome were also pertinent to this visit.  Plan of Care  Problem-specific:  No problem-specific Assessment & Plan notes found for this encounter.  Mr. Irish Piech has a current medication list which includes the following long-term medication(s): levothyroxine, montelukast, omeprazole, sildenafil, and gabapentin.  Pharmacotherapy (Medications Ordered): Meds ordered this encounter  Medications   gabapentin (NEURONTIN) 300 MG capsule    Sig: Take 1 capsule (300 mg total) by mouth 2 (two) times daily.    Dispense:  60 capsule    Refill:  5   diclofenac (VOLTAREN) 75 MG EC tablet    Sig: Take 1 tablet (75 mg total) by mouth 2 (two) times daily.    Dispense:  60 tablet    Refill:  5    Follow-up plan:   Return in about 6 months (around 06/03/2021) for Medication Management, in person.    Recent Visits No visits were found meeting these conditions. Showing recent visits within past 90 days and meeting all other requirements Today's Visits Date Type Provider Dept  12/03/20 Office Visit Gillis Santa, MD [facility]  Showing today's visits and meeting all other requirements Future Appointments No visits were found meeting these conditions. Showing future appointments within next 90 days and meeting all other requirements I discussed the assessment and treatment plan with the patient. The patient was provided an opportunity to ask questions and all were answered. The patient agreed with the plan and demonstrated an understanding of the instructions.  Patient advised to call back or seek an in-person evaluation if the  symptoms or condition worsens.  Duration of encounter: 81mnutes.  Note by: BGillis Santa MD Date:  12/03/2020; Time: 10:13 AM

## 2021-05-20 ENCOUNTER — Encounter
Payer: No Typology Code available for payment source | Admitting: Student in an Organized Health Care Education/Training Program
